# Patient Record
Sex: Female | Born: 1985 | Race: White | Hispanic: No | Marital: Single | Smoking: Current some day smoker
Health system: Southern US, Community
[De-identification: ages and names within clinical notes are randomized; demographics above are authoritative.]

## PROBLEM LIST (undated history)

## (undated) ENCOUNTER — Inpatient Hospital Stay (HOSPITAL_COMMUNITY): Payer: Self-pay

## (undated) DIAGNOSIS — B999 Unspecified infectious disease: Secondary | ICD-10-CM

## (undated) DIAGNOSIS — D649 Anemia, unspecified: Secondary | ICD-10-CM

## (undated) DIAGNOSIS — F32A Depression, unspecified: Secondary | ICD-10-CM

## (undated) DIAGNOSIS — Z87891 Personal history of nicotine dependence: Secondary | ICD-10-CM

## (undated) DIAGNOSIS — O26899 Other specified pregnancy related conditions, unspecified trimester: Secondary | ICD-10-CM

## (undated) DIAGNOSIS — F329 Major depressive disorder, single episode, unspecified: Secondary | ICD-10-CM

## (undated) DIAGNOSIS — Z332 Encounter for elective termination of pregnancy: Secondary | ICD-10-CM

## (undated) DIAGNOSIS — F419 Anxiety disorder, unspecified: Secondary | ICD-10-CM

## (undated) DIAGNOSIS — F1911 Other psychoactive substance abuse, in remission: Secondary | ICD-10-CM

## (undated) DIAGNOSIS — R12 Heartburn: Secondary | ICD-10-CM

## (undated) DIAGNOSIS — R51 Headache: Secondary | ICD-10-CM

## (undated) DIAGNOSIS — Z8659 Personal history of other mental and behavioral disorders: Secondary | ICD-10-CM

## (undated) DIAGNOSIS — R519 Headache, unspecified: Secondary | ICD-10-CM

---

## 2014-02-18 LAB — OB RESULTS CONSOLE HIV ANTIBODY (ROUTINE TESTING): HIV: NONREACTIVE

## 2014-02-18 LAB — OB RESULTS CONSOLE ANTIBODY SCREEN: Antibody Screen: NEGATIVE

## 2014-02-18 LAB — OB RESULTS CONSOLE RPR: RPR: NONREACTIVE

## 2014-02-18 LAB — OB RESULTS CONSOLE RUBELLA ANTIBODY, IGM: Rubella: IMMUNE

## 2014-02-18 LAB — OB RESULTS CONSOLE ABO/RH: RH Type: POSITIVE

## 2014-02-18 LAB — OB RESULTS CONSOLE HEPATITIS B SURFACE ANTIGEN: Hepatitis B Surface Ag: NEGATIVE

## 2014-06-20 ENCOUNTER — Inpatient Hospital Stay (HOSPITAL_COMMUNITY): Admission: AD | Admit: 2014-06-20 | Payer: Self-pay | Source: Ambulatory Visit | Admitting: Obstetrics and Gynecology

## 2014-08-20 ENCOUNTER — Other Ambulatory Visit (HOSPITAL_COMMUNITY): Payer: Self-pay | Admitting: Obstetrics and Gynecology

## 2014-08-20 DIAGNOSIS — O365933 Maternal care for other known or suspected poor fetal growth, third trimester, fetus 3: Secondary | ICD-10-CM

## 2014-08-20 DIAGNOSIS — Z3A32 32 weeks gestation of pregnancy: Secondary | ICD-10-CM

## 2014-08-20 DIAGNOSIS — Z3689 Encounter for other specified antenatal screening: Secondary | ICD-10-CM

## 2014-08-23 ENCOUNTER — Other Ambulatory Visit (HOSPITAL_COMMUNITY): Payer: Self-pay | Admitting: Obstetrics and Gynecology

## 2014-08-23 ENCOUNTER — Ambulatory Visit (HOSPITAL_COMMUNITY)
Admission: RE | Admit: 2014-08-23 | Discharge: 2014-08-23 | Disposition: A | Discharge status: Home / Self Care | Payer: Medicaid Other | Source: Ambulatory Visit | Attending: Obstetrics and Gynecology | Admitting: Obstetrics and Gynecology

## 2014-08-23 ENCOUNTER — Other Ambulatory Visit (HOSPITAL_COMMUNITY): Payer: Self-pay

## 2014-08-23 ENCOUNTER — Encounter (HOSPITAL_COMMUNITY): Payer: Self-pay

## 2014-08-23 DIAGNOSIS — Z3689 Encounter for other specified antenatal screening: Secondary | ICD-10-CM

## 2014-08-23 DIAGNOSIS — Z3A32 32 weeks gestation of pregnancy: Secondary | ICD-10-CM

## 2014-08-23 DIAGNOSIS — O365930 Maternal care for other known or suspected poor fetal growth, third trimester, not applicable or unspecified: Secondary | ICD-10-CM | POA: Insufficient documentation

## 2014-08-23 DIAGNOSIS — O3421 Maternal care for scar from previous cesarean delivery: Secondary | ICD-10-CM | POA: Diagnosis not present

## 2014-08-23 DIAGNOSIS — O365933 Maternal care for other known or suspected poor fetal growth, third trimester, fetus 3: Secondary | ICD-10-CM

## 2014-08-23 DIAGNOSIS — O365990 Maternal care for other known or suspected poor fetal growth, unspecified trimester, not applicable or unspecified: Secondary | ICD-10-CM | POA: Insufficient documentation

## 2014-08-23 NOTE — Consult Note (Signed)
Maternal Fetal Medicine Consultation  Requesting Provider(s): Osborn CohoAngela Roberts, MD  Reason for consultation: Lagging growth, elevated UA Doppler S/D ratio at [redacted] weeks gestation  HPI: Christine ClarkRebecca Lynn Beck is a 28 year old G2P1001 currently at 32w 3d who is referred for testing and delivery recommendations due to lagging growth and elevated UA S/D ratios noted on recent clinic ultrasound.  Ms. Su HiltRoberts reports a history of a previous term C-section due to non-reassuring fetal tracing.  Her prenatal course has otherwise been uncomplicated.  The fetus is active, and she is without complaints today.  OB History: OB History    Gravida Para Term Preterm AB TAB SAB Ectopic Multiple Living   4 1 1  0 2 0 2 0 0 1      PMH: History reviewed. No pertinent past medical history.  PSH: C-section  Meds: Prenatal vitamins, Iron supplements  Allergies:  Allergies  Allergen Reactions  . Abilify [Aripiprazole]    FH: History reviewed. No pertinent family history.   Soc:  Denies tobacco, ETOH use  Review of Systems: no vaginal bleeding or cramping/contractions, no LOF, no nausea/vomiting. All other systems reviewed and are negative.  PE:  158#, 92/68, 78  GEN: well-appearing female ABD: gravid, NT  Ultrasound: Single IUP at 32w 3d The overall estimated fetal weight is at the 27th %tile.  The AC measures at the 7th %tile. BPP 8/8 UA Doppler studies - high normal S/D ratio (93rd %tile).  No AEDF or REDF noted. Normal amniotic fluid volume  A/P: 1) Single IUP at 32w 3d         2) Lagging growth / asymmetric pattern (Overall EFW at the 27th %tile, AC at the 7th %tile) - Ultrasound findings and recommendations were discussed.  Recommendations: 1) Start antenatal testing now - recommend 2x weekly NSTs with weekly AFIs and UA Doppler studies. 2) Ultrasound for growth in 3 weeks. 3) Would recommend delivery at or after 34 weeks if absent end-diastolic flow is noted on follow up testing. 3) If the  estimated fetal weight is at or below the 10th%tile at follow up exam and testing is otherwise reassuring, would recommend delivery at 37 weeks (the patient reports that she has a current repeat C-section date at 37 weeks now).  If the interval growth is appropriate and testing is otherwise reassuring, would recommend delivery at 38-[redacted] weeks gestation.  Please contact our office if you would prefer that follow up ultrasounds or antenatal testing be performed with MFM.   Thank you for the opportunity to be a part of the care of Christine Beck. Please contact our office if we can be of further assistance.   I spent approximately 30 minutes with this patient with over 50% of time spent in face-to-face counseling.  Alpha GulaPaul Camisha Srey, MD Maternal Fetal Medicine

## 2014-08-30 ENCOUNTER — Other Ambulatory Visit: Payer: Self-pay | Admitting: Obstetrics and Gynecology

## 2014-08-31 ENCOUNTER — Encounter (HOSPITAL_COMMUNITY): Payer: Self-pay | Admitting: *Deleted

## 2014-08-31 ENCOUNTER — Inpatient Hospital Stay (HOSPITAL_COMMUNITY)
Admission: AD | Admit: 2014-08-31 | Discharge: 2014-08-31 | Disposition: A | Discharge status: Home / Self Care | Payer: Medicaid Other | Source: Ambulatory Visit | Attending: Obstetrics and Gynecology | Admitting: Obstetrics and Gynecology

## 2014-08-31 ENCOUNTER — Inpatient Hospital Stay (HOSPITAL_COMMUNITY): Payer: Medicaid Other

## 2014-08-31 DIAGNOSIS — Z609 Problem related to social environment, unspecified: Secondary | ICD-10-CM

## 2014-08-31 DIAGNOSIS — F411 Generalized anxiety disorder: Secondary | ICD-10-CM | POA: Diagnosis not present

## 2014-08-31 DIAGNOSIS — O365930 Maternal care for other known or suspected poor fetal growth, third trimester, not applicable or unspecified: Secondary | ICD-10-CM | POA: Diagnosis present

## 2014-08-31 DIAGNOSIS — F329 Major depressive disorder, single episode, unspecified: Secondary | ICD-10-CM | POA: Diagnosis present

## 2014-08-31 DIAGNOSIS — Z659 Problem related to unspecified psychosocial circumstances: Secondary | ICD-10-CM

## 2014-08-31 DIAGNOSIS — O3421 Maternal care for scar from previous cesarean delivery: Secondary | ICD-10-CM | POA: Insufficient documentation

## 2014-08-31 DIAGNOSIS — O365990 Maternal care for other known or suspected poor fetal growth, unspecified trimester, not applicable or unspecified: Secondary | ICD-10-CM | POA: Diagnosis present

## 2014-08-31 DIAGNOSIS — O99343 Other mental disorders complicating pregnancy, third trimester: Secondary | ICD-10-CM | POA: Insufficient documentation

## 2014-08-31 DIAGNOSIS — F32A Depression, unspecified: Secondary | ICD-10-CM | POA: Diagnosis present

## 2014-08-31 DIAGNOSIS — Z3A33 33 weeks gestation of pregnancy: Secondary | ICD-10-CM | POA: Diagnosis not present

## 2014-08-31 DIAGNOSIS — O34219 Maternal care for unspecified type scar from previous cesarean delivery: Secondary | ICD-10-CM | POA: Diagnosis present

## 2014-08-31 DIAGNOSIS — IMO0002 Reserved for concepts with insufficient information to code with codable children: Secondary | ICD-10-CM

## 2014-08-31 DIAGNOSIS — F988 Other specified behavioral and emotional disorders with onset usually occurring in childhood and adolescence: Secondary | ICD-10-CM | POA: Diagnosis present

## 2014-08-31 MED ORDER — BETAMETHASONE SOD PHOS & ACET 6 (3-3) MG/ML IJ SUSP
12.0000 mg | INTRAMUSCULAR | Status: DC
  Administered 2014-08-31: 12 mg via INTRAMUSCULAR
  Filled 2014-08-31 (×2): qty 2

## 2014-08-31 NOTE — MAU Provider Note (Signed)
History   28 yo G4P1021 at 4633 4/7 weeks presented as directed by Dr. Su Hiltoberts for NST and betamethasone course due to IUGR and abnormal dopplers.  Had BPP 12/23 at office, with BPP 8/8.  Scheduled for office visit 12/28, with US for growth/BPP/dopplers. Scheduled for NST 09/05/14.  Reports +FM, denies leaking, bleeding, or any other issue.  Patient Active Problem List   Diagnosis Date Noted  . Generalized anxiety disorder 08/31/2014  . Chronic depression 08/31/2014  . Previous cesarean delivery, antepartum condition or complication 08/31/2014  . Imprisonment--06/2013-11/2013 08/31/2014  . Social problem--mother killed in fire 2014, father with terminal lung CA, patient in custody battle  08/31/2014  . ADD (attention deficit disorder) 08/31/2014  . 32 week prematurity   . IUGR (intrauterine growth restriction) affecting care of mother--abnormal dopplers     Chief Complaint  Patient presents with  . Non-stress Test   HPI:  As above  OB History    Gravida Para Term Preterm AB TAB SAB Ectopic Multiple Living   4 1 1  0 2 0 2 0 0 1      Past Medical History  Diagnosis Date  . Medical history non-contributory     Past Surgical History  Procedure Laterality Date  . Cesarean section      History reviewed. No pertinent family history.  History  Substance Use Topics  . Smoking status: Never Smoker   . Smokeless tobacco: Not on file  . Alcohol Use: No    Allergies:  Allergies  Allergen Reactions  . Abilify [Aripiprazole]     Prescriptions prior to admission  Medication Sig Dispense Refill Last Dose  . Prenatal Vit w/Fe-Methylfol-FA (PNV PO) Take by mouth.       ROS:  +FM  Physical Exam   Blood pressure 81/51, pulse 95, temperature 97.4 F (36.3 C), temperature source Oral, resp. rate 16, height 5\' 2"  (1.575 m), weight 160 lb 12.8 oz (72.938 kg).  Physical Exam  Chest clear Heart RRR without murmur Abd gravid, NT Pelvic--deferred Ext WNL  FHR--Current  baseline appears to be 150-160 in current sleep cycle.  Prior tracing difficult to establish baseline due to ongoing fetal movement and possible broad accels with quick returns to baseline vs higher baseline with variables. UCs none  ED Course  Assessment: IUP at 33 4/7 weeks IUGR Abnormal dopplers Previous C/S, plans repeat Hx anxiety/depression  Plan: BPP/AFI.   Nigel BridgemanLATHAM, Seletha Zimmermann CNM, MSN 08/31/2014 1:21 PM  Addendum: Returned from US:  BPP 8/8, AFI 14.75, 52%ile, vtx.  FHR Category 1 on tracing prior to BPP.  No UCs.  Consulted with Dr. Normand Sloopillard. D/C home. Return to MAU tomorrow for 2nd dose betamethasone. Keep scheduled ROB visit with US/BPP on Monday. FKC discussed. F/U prn with any issues or concerns.  Nigel BridgemanVicki Tyrez Berrios, CNM 08/31/14 3:40p

## 2014-08-31 NOTE — MAU Note (Signed)
Pt sent in for betamethasone injections and NST.  Denies LOF/VB/cramping.

## 2014-09-01 ENCOUNTER — Inpatient Hospital Stay (HOSPITAL_COMMUNITY)
Admission: AD | Admit: 2014-09-01 | Discharge: 2014-09-01 | Disposition: A | Discharge status: Home / Self Care | Payer: Medicaid Other | Source: Ambulatory Visit | Attending: Obstetrics & Gynecology | Admitting: Obstetrics & Gynecology

## 2014-09-01 DIAGNOSIS — Z3A33 33 weeks gestation of pregnancy: Secondary | ICD-10-CM | POA: Insufficient documentation

## 2014-09-01 MED ORDER — BETAMETHASONE SOD PHOS & ACET 6 (3-3) MG/ML IJ SUSP
12.0000 mg | Freq: Once | INTRAMUSCULAR | Status: AC
  Administered 2014-09-01: 12 mg via INTRAMUSCULAR
  Filled 2014-09-01: qty 2

## 2014-09-01 NOTE — MAU Note (Signed)
Pt presents to MAU for 2nd betamethasone injection. Denies any reaction from first injection

## 2014-09-02 ENCOUNTER — Encounter (HOSPITAL_COMMUNITY): Payer: Self-pay | Admitting: *Deleted

## 2014-09-02 ENCOUNTER — Inpatient Hospital Stay (HOSPITAL_COMMUNITY)
Admission: AD | Admit: 2014-09-02 | Discharge: 2014-09-02 | Disposition: A | Discharge status: Home / Self Care | Payer: Medicaid Other | Source: Ambulatory Visit | Attending: Obstetrics & Gynecology | Admitting: Obstetrics & Gynecology

## 2014-09-02 DIAGNOSIS — F411 Generalized anxiety disorder: Secondary | ICD-10-CM | POA: Insufficient documentation

## 2014-09-02 DIAGNOSIS — O99343 Other mental disorders complicating pregnancy, third trimester: Secondary | ICD-10-CM | POA: Insufficient documentation

## 2014-09-02 DIAGNOSIS — O365930 Maternal care for other known or suspected poor fetal growth, third trimester, not applicable or unspecified: Secondary | ICD-10-CM | POA: Diagnosis present

## 2014-09-02 DIAGNOSIS — Z3A33 33 weeks gestation of pregnancy: Secondary | ICD-10-CM | POA: Insufficient documentation

## 2014-09-02 DIAGNOSIS — O3421 Maternal care for scar from previous cesarean delivery: Secondary | ICD-10-CM | POA: Diagnosis not present

## 2014-09-02 HISTORY — DX: Anxiety disorder, unspecified: F41.9

## 2014-09-02 HISTORY — DX: Headache: R51

## 2014-09-02 HISTORY — DX: Headache, unspecified: R51.9

## 2014-09-02 HISTORY — DX: Depression, unspecified: F32.A

## 2014-09-02 HISTORY — DX: Major depressive disorder, single episode, unspecified: F32.9

## 2014-09-02 HISTORY — DX: Unspecified infectious disease: B99.9

## 2014-09-02 NOTE — MAU Provider Note (Signed)
History  28 yo G4P1021 @ 33.6 wks presents to MAU from office [facility]ST [facility]2 breathing). IUGR and abnormal dopplers since 32 wks. Pt reports active fetus. Denies LOF, ctxs or VB.  Patient Active Problem List   Diagnosis Date Noted  . [redacted] weeks gestation of pregnancy   . Generalized anxiety disorder 08/31/2014  . Chronic depression 08/31/2014  . Previous cesarean delivery, antepartum condition or complication 08/31/2014  . Imprisonment--06/2013-11/2013 08/31/2014  . Social problem--mother killed in fire 2014, father with terminal lung CA, patient in custody battle  08/31/2014  . ADD (attention deficit disorder) 08/31/2014  . 32 week prematurity   . IUGR (intrauterine growth restriction) affecting care of mother--abnormal dopplers     Chief Complaint  Patient presents with  . NST    HPI As above OB History    Gravida Para Term Preterm AB TAB SAB Ectopic Multiple Living   4 1 1  0 2 0 2 0 0 1      Obstetric Comments   Mat and fetal heart rate bottomed out when on pit      Past Medical History  Diagnosis Date  . Medical history non-contributory   . Headache   . Infection     UTI  . Anxiety   . Depression     Past Surgical History  Procedure Laterality Date  . Cesarean section      Family History  Problem Relation Age of Onset  . Cancer Father     History  Substance Use Topics  . Smoking status: Never Smoker   . Smokeless tobacco: Never Used  . Alcohol Use: No    Allergies:  Allergies  Allergen Reactions  . Abilify [Aripiprazole] Other (See Comments)    Walking seizure    Prescriptions prior to admission  Medication Sig Dispense Refill Last Dose  . acetaminophen (TYLENOL) 500 MG tablet Take 1,000 mg by mouth every 6 (six) hours as needed for mild pain.   09/02/2014 at Unknown time  . ferrous sulfate 325 (65 FE) MG tablet Take 325 mg by mouth 2 (two) times daily with a meal.   09/02/2014 at Unknown time  . Prenatal Vit w/Fe-Methylfol-FA (PNV PO)  Take 1 tablet by mouth daily.    09/02/2014 at Unknown time    ROS  +FM Physical Exam   Blood pressure 99/56, pulse 92, temperature 97.9 F (36.6 C), temperature source Oral, resp. rate 18.  Physical Exam Gen: NAD Abd: gravid, soft, NT Pelvic: Deferred FHRT: BL 130 w/ moderate variability, +accels, no decels UCs: None per toco, none palpated ED Course  Assessment: Cat 1 FHRT  Plan: D/C home Strict PTL precautions OB f/u as previously scheduled   Sherre ScarletWILLIAMS, Miraj Truss CNM, MS 09/02/2014 5:43 PM

## 2014-09-02 NOTE — MAU Note (Signed)
Pt has hx of IUGR w/ abn doppler studies.  6/8 on office BPP, sent over for NST.

## 2014-09-04 ENCOUNTER — Other Ambulatory Visit: Payer: Self-pay | Admitting: Obstetrics and Gynecology

## 2014-09-23 ENCOUNTER — Encounter (HOSPITAL_COMMUNITY): Payer: Self-pay | Admitting: *Deleted

## 2014-09-23 ENCOUNTER — Other Ambulatory Visit (HOSPITAL_COMMUNITY): Payer: Medicaid Other

## 2014-09-23 NOTE — Patient Instructions (Signed)
   Your procedure is scheduled on:  Tuesday, Jan 19  [facility] at:  10 AM Pick up the phone at the desk and dial [phone] and inform us of your arrival.  Please call this number if you have any problems the morning of surgery: [phone]  Remember: Do not eat or drink after midnight: Monday Take these medicines the morning of surgery with a SIP OF WATER:  None  Do not wear jewelry, make-up, or FINGER nail polish No metal in your hair or on your body. Do not wear lotions, powders, perfumes.  You may wear deodorant.  Do not bring valuables to the hospital. Contacts, dentures or bridgework may not be worn into surgery.  Leave suitcase in the car. After Surgery it may be brought to your room. For patients being admitted to the hospital, checkout time is 11:00am the day of discharge.

## 2014-09-25 DIAGNOSIS — B951 Streptococcus, group B, as the cause of diseases classified elsewhere: Secondary | ICD-10-CM | POA: Diagnosis present

## 2014-09-25 NOTE — H&P (Signed)
Christine Beck is a 29 y.o. female, G4P1021 at 4538 weeks, presenting for scheduled repeat cesearean.  Denies leaking or bleeding, reports +FM.  Patient Active Problem List   Diagnosis Date Noted  . Positive GBS test 09/25/2014  . Generalized anxiety disorder 08/31/2014  . Chronic depression 08/31/2014  . Previous cesarean delivery, antepartum condition or complication 08/31/2014  . Imprisonment--06/2013-11/2013 08/31/2014  . Social problem--mother killed in fire 2014, father with terminal lung CA, patient in custody battle  08/31/2014  . ADD (attention deficit disorder) 08/31/2014  . IUGR (intrauterine growth restriction) affecting care of mother, hx abnormal dopplers--normal growth and dopplers at 37 weeks.     History of present pregnancy: Patient entered care at 6 1/7 weeks.   EDC of 10/15/14 was established by LMP and was concordant with US at 6 2/7 weeks.   Anatomy scan:  18 3/7 weeks, with normal findings and an posterior placenta.   Additional US evaluations:   31 2/7 weeks for S<D:  EFW 3 lbs, 3.5%, AFI 12.81, 35%ile, BPP 8/8, elevated UA dopplers, no reverse/absent flow. 32 3/7 weeks:  EFW 1654 gm, 3+10, 27%ile, AFI 11.46, 28%ile, AC 7%ile, high normal dopplers, vtx.  Weekly BPPs were WNL, with persistently elevated dopplers, normal fluid.  Single BPP at 35 6/7 weeks was 6/8, but had reactive NST. 33 6/7 weeks:  EFW 4+7, 13%ile, AFI 12.5, 40%ile, elevated dopplers, no absent/reverse flow.  BPP 8/8, vtx. 34 5/7 weeks:  EFW 5+12, 15%ile, AFI 13.1, 50%ile, dopplers WNL, BPP 8/8 Significant prenatal events:  Entered care at 6 1/7 weeks.  Taking clonazepam in early pregnancy, d/c'd.  Took Zoloft during most of pregnancy.  Had been incarcerated last year.  Did not have custody of 1st child, was in custody battle with brother and sister-in-law in [place].  Traveled there several times during pregnancy.  Several urine drug screen done at patient's request during pregnancy at patient's request  for documentation for the custody case--all negative.  Z pak given at 29 weeks due to persistent cough with mucus.  Patient awarded custody of son at 428 weeks.  US at 32 weeks showed IUGR and abnormal dopplers, so patient was followed closely from that point.  Initial plan made for repeat C/S at 37 weeks per recommendation of MFM, but IUGR and abnormal dopplers resolved on US on 09/24/14, so C/S delayed until 38 weeks. Last evaluation:  09/24/14--US with growth at 15%ile, normal fluid and dopplers.  C/S rescheduled for 38 weeks (vs 37 weeks)  OB History    Gravida Para Term Preterm AB TAB SAB Ectopic Multiple Living   4 1 1  0 2 2 0 0 0 2      Obstetric Comments   Mat and fetal heart rate bottomed out when on pit    2005--TAB at 6 weeks 2006--TAB at 6 weeks 2013--Primary LTCS due to NRFHR, 39 weeks, 12 hour labor, 5+10, delivered in [place] (LTCS, with single layer closure)    Past Medical History  Diagnosis Date  . Medical history non-contributory   . Headache   . Infection     UTI  . Anxiety   . Depression   . Termination of pregnancy (fetus) x 2    2005, 2006 - both at 6 wks   Past Surgical History  Procedure Laterality Date  . Cesarean section     Family History: family history includes Cancer in her father. Mother killed in fire 12/2012,  Social History:  reports that she has never smoked. She  has never used smokeless tobacco. She reports that she does not drink alcohol or use illicit drugs.  Patient is single, Caucasian, high-school educated, employed as a Production assistant, radio, of the [employer].  Had negative UDS several times during pregnancy, done due to custody battle.  Recently moved to [facility] from [place].   Prenatal Transfer Tool  Maternal Diabetes: No Genetic Screening: Normal 1st trimester screen Maternal Ultrasounds/Referrals: Abnormal:  Findings:   IUGR, Other:Abnormal dopplers--IUGR and abnormal dopplers since 32 weeks, normalized at 37 weeks. Fetal Ultrasounds or  other Referrals:  Referred to Materal Fetal Medicine --Seen 08/23/14 due to lagging growth, elevated dopplers at 32 weeks. Maternal Substance Abuse:  No--several negative UDS during pregnancy due to custody battle, last one 07/05/14 Significant Maternal Medications:  None Significant Maternal Lab Results: Lab values include: Group B Strep positive  TDAP 08/05/14 Flu 9/11/5  ROS:  +FM, occasional cramping  Allergies  Allergen Reactions  . Abilify [Aripiprazole] Other (See Comments)    Walking seizure      There were no vitals taken for this visit.  Chest clear Heart RRR without murmur Abd gravid, NT, FH 36 weeks Pelvic: Deferred Ext: WNL  FHR: 150 in office UCs:  None  Prenatal labs: ABO, Rh: B/Positive/-- (06/15 0000) Antibody: Negative (06/15 0000) Rubella:    Immune RPR: Nonreactive (06/15 0000)  HBsAg: Negative (06/15 0000)  HIV: Non-reactive (06/15 0000)  GBS:   Positive 09/16/14 Sickle cell/Hgb electrophoresis:  NA Pap:  WNL 03/20/14 GC:  Negative 02/18/14 and 09/16/14 Chlamydia:  Negative 02/18/14 and 09/16/14 Genetic screenings:  Normal 1st trimester screen Glucola:  WNL Other:   Hgb 11.9 at NOB, 10.9 at 28 weeks Urine culture 15L Ecoli at NOB, 3K on repeat culture    Assessment/Plan: IUP at 38 weeks Previous LTCS, desires repeat GBS positive Hx IUGR with abnormal dopplers, normalized 09/24/14 Anxiety/depression/ADHD Social stressors  Plan: Ad[facility] to [facility] per consult with Dr. Normand Sloop Routine pre-op orders Plan SW consult pp  Nyra Capes, [place] 09/25/2014, 12:57 PM

## 2014-09-27 ENCOUNTER — Encounter (HOSPITAL_COMMUNITY): Payer: Self-pay

## 2014-09-27 NOTE — Patient Instructions (Addendum)
   Your procedure is scheduled on:  Tuesday, Jan 26  [facility] at: 10 AM Pick up the phone at the desk and dial [phone] and inform us of your arrival.  Please call this number if you have any problems the morning of surgery: [phone]  Remember: Do not eat or drink after midnight: Monday Take these medicines the morning of surgery with a SIP OF WATER:  None   Do not wear jewelry, make-up, or FINGER nail polish No metal in your hair or on your body. Do not wear lotions, powders, perfumes.  You may wear deodorant.  Do not bring valuables to the hospital. Contacts, dentures or bridgework may not be worn into surgery.  Leave suitcase in the car. After Surgery it may be brought to your room. For patients being admitted to the hospital, checkout time is 11:00am the day of discharge.  Home with fiance Thayer OhmChris cell [phone] or Candy cell [phone]

## 2014-09-30 ENCOUNTER — Encounter (HOSPITAL_COMMUNITY)
Admission: RE | Admit: 2014-09-30 | Discharge: 2014-09-30 | Disposition: A | Discharge status: Home / Self Care | Payer: Medicaid Other | Source: Ambulatory Visit | Attending: Obstetrics and Gynecology | Admitting: Obstetrics and Gynecology

## 2014-09-30 ENCOUNTER — Encounter (HOSPITAL_COMMUNITY): Payer: Self-pay

## 2014-09-30 HISTORY — DX: Anemia, unspecified: D64.9

## 2014-09-30 HISTORY — DX: Other specified pregnancy related conditions, unspecified trimester: O26.899

## 2014-09-30 HISTORY — DX: Heartburn: R12

## 2014-09-30 LAB — CBC
HEMATOCRIT: 34.3 % — AB (ref 36.0–46.0)
Hemoglobin: 11.5 g/dL — ABNORMAL LOW (ref 12.0–15.0)
MCH: 33.7 pg (ref 26.0–34.0)
MCHC: 33.5 g/dL (ref 30.0–36.0)
MCV: 100.6 fL — AB (ref 78.0–100.0)
PLATELETS: 204 10*3/uL (ref 150–400)
RBC: 3.41 MIL/uL — ABNORMAL LOW (ref 3.87–5.11)
RDW: 12.8 % (ref 11.5–15.5)
WBC: 10.1 10*3/uL (ref 4.0–10.5)

## 2014-09-30 LAB — TYPE AND SCREEN
ABO/RH(D): B POS
Antibody Screen: NEGATIVE

## 2014-09-30 LAB — ABO/RH: ABO/RH(D): B POS

## 2014-09-30 NOTE — Anesthesia Preprocedure Evaluation (Addendum)
Anesthesia Evaluation  Patient identified by MRN, date of birth, ID band Patient awake    Reviewed: Allergy & Precautions, NPO status , Patient's Chart, lab work & pertinent test results  History of Anesthesia Complications Negative for: history of anesthetic complications  Airway Mallampati: II  TM Distance: >3 FB Neck ROM: Full    Dental no notable dental hx. (+) Dental Advisory Given   Pulmonary neg pulmonary ROS,  breath sounds clear to auscultation  Pulmonary exam normal       Cardiovascular negative cardio ROS  Rhythm:Regular Rate:Normal     Neuro/Psych  Headaches, PSYCHIATRIC DISORDERS Anxiety Depression    GI/Hepatic negative GI ROS, Neg liver ROS,   Endo/Other  negative endocrine ROS  Renal/GU negative Renal ROS  negative genitourinary   Musculoskeletal negative musculoskeletal ROS (+)   Abdominal   Peds negative pediatric ROS (+)  Hematology  (+) anemia ,   Anesthesia Other Findings   Reproductive/Obstetrics (+) Pregnancy                            Anesthesia Physical Anesthesia Plan  ASA: II  Anesthesia Plan: Spinal   Post-op Pain Management:    Induction:   Airway Management Planned:   Additional Equipment:   Intra-op Plan:   Post-operative Plan:   Informed Consent: I have reviewed the patients History and Physical, chart, labs and discussed the procedure including the risks, benefits and alternatives for the proposed anesthesia with the patient or authorized representative who has indicated his/her understanding and acceptance.   Dental advisory given  Plan Discussed with: CRNA  Anesthesia Plan Comments:         Anesthesia Quick Evaluation

## 2014-10-01 ENCOUNTER — Inpatient Hospital Stay (HOSPITAL_COMMUNITY)
Admission: RE | Admit: 2014-10-01 | Discharge: 2014-10-04 | DRG: 766 | Disposition: A | Discharge status: Home / Self Care | Payer: Medicaid Other | Source: Ambulatory Visit | Attending: Obstetrics and Gynecology | Admitting: Obstetrics and Gynecology

## 2014-10-01 ENCOUNTER — Inpatient Hospital Stay (HOSPITAL_COMMUNITY): Payer: Medicaid Other | Admitting: Anesthesiology

## 2014-10-01 ENCOUNTER — Encounter (HOSPITAL_COMMUNITY): Payer: Self-pay | Admitting: *Deleted

## 2014-10-01 ENCOUNTER — Encounter (HOSPITAL_COMMUNITY)
Admission: RE | Disposition: A | Discharge status: Home / Self Care | Payer: Self-pay | Source: Ambulatory Visit | Attending: Obstetrics and Gynecology

## 2014-10-01 DIAGNOSIS — B951 Streptococcus, group B, as the cause of diseases classified elsewhere: Secondary | ICD-10-CM | POA: Diagnosis present

## 2014-10-01 DIAGNOSIS — D649 Anemia, unspecified: Secondary | ICD-10-CM | POA: Diagnosis not present

## 2014-10-01 DIAGNOSIS — O0973 Supervision of high risk pregnancy due to social problems, third trimester: Secondary | ICD-10-CM

## 2014-10-01 DIAGNOSIS — O99824 Streptococcus B carrier state complicating childbirth: Secondary | ICD-10-CM | POA: Diagnosis present

## 2014-10-01 DIAGNOSIS — F988 Other specified behavioral and emotional disorders with onset usually occurring in childhood and adolescence: Secondary | ICD-10-CM | POA: Diagnosis present

## 2014-10-01 DIAGNOSIS — O99344 Other mental disorders complicating childbirth: Secondary | ICD-10-CM | POA: Diagnosis present

## 2014-10-01 DIAGNOSIS — O3421 Maternal care for scar from previous cesarean delivery: Secondary | ICD-10-CM | POA: Diagnosis present

## 2014-10-01 DIAGNOSIS — O9081 Anemia of the puerperium: Secondary | ICD-10-CM | POA: Diagnosis not present

## 2014-10-01 DIAGNOSIS — N858 Other specified noninflammatory disorders of uterus: Secondary | ICD-10-CM | POA: Diagnosis present

## 2014-10-01 DIAGNOSIS — Z3A38 38 weeks gestation of pregnancy: Secondary | ICD-10-CM | POA: Diagnosis present

## 2014-10-01 DIAGNOSIS — F411 Generalized anxiety disorder: Secondary | ICD-10-CM | POA: Diagnosis present

## 2014-10-01 DIAGNOSIS — Z888 Allergy status to other drugs, medicaments and biological substances status: Secondary | ICD-10-CM

## 2014-10-01 DIAGNOSIS — F329 Major depressive disorder, single episode, unspecified: Secondary | ICD-10-CM | POA: Diagnosis present

## 2014-10-01 DIAGNOSIS — F32A Depression, unspecified: Secondary | ICD-10-CM | POA: Diagnosis present

## 2014-10-01 DIAGNOSIS — F53 Puerperal psychosis: Secondary | ICD-10-CM | POA: Diagnosis not present

## 2014-10-01 DIAGNOSIS — Z98891 History of uterine scar from previous surgery: Secondary | ICD-10-CM

## 2014-10-01 HISTORY — DX: Personal history of other mental and behavioral disorders: Z86.59

## 2014-10-01 HISTORY — DX: Encounter for elective termination of pregnancy: Z33.2

## 2014-10-01 HISTORY — DX: Other psychoactive substance abuse, in remission: F19.11

## 2014-10-01 HISTORY — DX: Personal history of nicotine dependence: Z87.891

## 2014-10-01 LAB — RPR: RPR: NONREACTIVE

## 2014-10-01 SURGERY — Surgical Case
Anesthesia: Spinal | Site: Abdomen

## 2014-10-01 MED ORDER — DIPHENHYDRAMINE HCL 25 MG PO CAPS: 25.0000 mg | ORAL_CAPSULE | Freq: Four times a day (QID) | ORAL | Status: DC | PRN

## 2014-10-01 MED ORDER — BUPIVACAINE IN DEXTROSE 0.75-8.25 % IT SOLN
INTRATHECAL | Status: DC | PRN
  Administered 2014-10-01: 1.6 mL via INTRATHECAL

## 2014-10-01 MED ORDER — FLEET ENEMA 7-19 GM/118ML RE ENEM: 1.0000 | ENEMA | Freq: Every day | RECTAL | Status: DC | PRN

## 2014-10-01 MED ORDER — ERYTHROMYCIN 5 MG/GM OP OINT
TOPICAL_OINTMENT | OPHTHALMIC | Status: AC
  Filled 2014-10-01: qty 1

## 2014-10-01 MED ORDER — CEFAZOLIN SODIUM-DEXTROSE 2-3 GM-% IV SOLR
2.0000 g | INTRAVENOUS | Status: AC
  Administered 2014-10-01: 2 g via INTRAVENOUS

## 2014-10-01 MED ORDER — OXYCODONE-ACETAMINOPHEN 5-325 MG PO TABS
2.0000 | ORAL_TABLET | ORAL | Status: DC | PRN
  Administered 2014-10-01 – 2014-10-04 (×15): 2 via ORAL
  Filled 2014-10-01 (×15): qty 2

## 2014-10-01 MED ORDER — PRENATAL MULTIVITAMIN CH
1.0000 | ORAL_TABLET | Freq: Every day | ORAL | Status: DC
  Administered 2014-10-02 – 2014-10-03 (×2): 1 via ORAL
  Filled 2014-10-01 (×2): qty 1

## 2014-10-01 MED ORDER — ONDANSETRON HCL 4 MG/2ML IJ SOLN: 4.0000 mg | Freq: Three times a day (TID) | INTRAMUSCULAR | Status: DC | PRN

## 2014-10-01 MED ORDER — NALBUPHINE HCL 10 MG/ML IJ SOLN: 5.0000 mg | INTRAMUSCULAR | Status: DC | PRN

## 2014-10-01 MED ORDER — SODIUM CHLORIDE 0.9 % IJ SOLN: 3.0000 mL | INTRAMUSCULAR | Status: DC | PRN

## 2014-10-01 MED ORDER — LACTATED RINGERS IV SOLN
INTRAVENOUS | Status: DC | PRN
  Administered 2014-10-01 (×3): via INTRAVENOUS

## 2014-10-01 MED ORDER — FENTANYL CITRATE 0.05 MG/ML IJ SOLN
25.0000 ug | INTRAMUSCULAR | Status: DC | PRN
  Administered 2014-10-01 (×2): 50 ug via INTRAVENOUS

## 2014-10-01 MED ORDER — WITCH HAZEL-GLYCERIN EX PADS: 1.0000 "application " | MEDICATED_PAD | CUTANEOUS | Status: DC | PRN

## 2014-10-01 MED ORDER — ONDANSETRON HCL 4 MG/2ML IJ SOLN: 4.0000 mg | Freq: Once | INTRAMUSCULAR | Status: DC | PRN

## 2014-10-01 MED ORDER — MEASLES, MUMPS & RUBELLA VAC ~~LOC~~ INJ: 0.5000 mL | INJECTION | Freq: Once | SUBCUTANEOUS | Status: DC

## 2014-10-01 MED ORDER — ONDANSETRON HCL 4 MG/2ML IJ SOLN
INTRAMUSCULAR | Status: AC
  Filled 2014-10-01: qty 2

## 2014-10-01 MED ORDER — DIPHENHYDRAMINE HCL 25 MG PO CAPS: 25.0000 mg | ORAL_CAPSULE | ORAL | Status: DC | PRN

## 2014-10-01 MED ORDER — FAMOTIDINE IN NACL 20-0.9 MG/50ML-% IV SOLN
INTRAVENOUS | Status: AC
  Filled 2014-10-01: qty 50

## 2014-10-01 MED ORDER — FENTANYL CITRATE 0.05 MG/ML IJ SOLN
INTRAMUSCULAR | Status: AC
  Administered 2014-10-01: 50 ug via INTRAVENOUS
  Filled 2014-10-01: qty 2

## 2014-10-01 MED ORDER — FERROUS SULFATE 325 (65 FE) MG PO TABS
325.0000 mg | ORAL_TABLET | Freq: Two times a day (BID) | ORAL | Status: DC
  Administered 2014-10-02 – 2014-10-04 (×5): 325 mg via ORAL
  Filled 2014-10-01 (×5): qty 1

## 2014-10-01 MED ORDER — PHENYLEPHRINE 8 MG IN D5W 100 ML (0.08MG/ML) PREMIX OPTIME
INJECTION | INTRAVENOUS | Status: AC
  Filled 2014-10-01: qty 100

## 2014-10-01 MED ORDER — KETOROLAC TROMETHAMINE 30 MG/ML IJ SOLN
30.0000 mg | Freq: Four times a day (QID) | INTRAMUSCULAR | Status: AC | PRN
Start: 2014-10-01 — End: 2014-10-02

## 2014-10-01 MED ORDER — NALBUPHINE HCL 10 MG/ML IJ SOLN: 5.0000 mg | Freq: Once | INTRAMUSCULAR | Status: AC | PRN

## 2014-10-01 MED ORDER — ONDANSETRON HCL 4 MG/2ML IJ SOLN
INTRAMUSCULAR | Status: DC | PRN
  Administered 2014-10-01: 4 mg via INTRAVENOUS

## 2014-10-01 MED ORDER — FENTANYL CITRATE 0.05 MG/ML IJ SOLN
INTRAMUSCULAR | Status: DC | PRN
  Administered 2014-10-01: 90 ug via INTRAVENOUS
  Administered 2014-10-01: 10 ug via INTRATHECAL

## 2014-10-01 MED ORDER — DIBUCAINE 1 % RE OINT: 1.0000 "application " | TOPICAL_OINTMENT | RECTAL | Status: DC | PRN

## 2014-10-01 MED ORDER — ONDANSETRON HCL 4 MG PO TABS: 4.0000 mg | ORAL_TABLET | ORAL | Status: DC | PRN

## 2014-10-01 MED ORDER — LANOLIN HYDROUS EX OINT
1.0000 | TOPICAL_OINTMENT | CUTANEOUS | Status: DC | PRN
Start: 2014-10-01 — End: 2014-10-04

## 2014-10-01 MED ORDER — SENNOSIDES-DOCUSATE SODIUM 8.6-50 MG PO TABS
2.0000 | ORAL_TABLET | ORAL | Status: DC
  Administered 2014-10-01 – 2014-10-04 (×3): 2 via ORAL
  Filled 2014-10-01 (×3): qty 2

## 2014-10-01 MED ORDER — SCOPOLAMINE 1 MG/3DAYS TD PT72
1.0000 | MEDICATED_PATCH | Freq: Once | TRANSDERMAL | Status: DC
  Administered 2014-10-01: 1.5 mg via TRANSDERMAL

## 2014-10-01 MED ORDER — LACTATED RINGERS IV SOLN
40.0000 [IU] | INTRAVENOUS | Status: DC | PRN
  Administered 2014-10-01: 40 [IU] via INTRAVENOUS

## 2014-10-01 MED ORDER — SCOPOLAMINE 1 MG/3DAYS TD PT72
MEDICATED_PATCH | TRANSDERMAL | Status: AC
  Administered 2014-10-01: 1.5 mg via TRANSDERMAL
  Filled 2014-10-01: qty 1

## 2014-10-01 MED ORDER — LACTATED RINGERS IV SOLN
Freq: Once | INTRAVENOUS | Status: AC
  Administered 2014-10-01: 11:00:00 via INTRAVENOUS

## 2014-10-01 MED ORDER — ONDANSETRON HCL 4 MG/2ML IJ SOLN: 4.0000 mg | INTRAMUSCULAR | Status: DC | PRN

## 2014-10-01 MED ORDER — DIPHENHYDRAMINE HCL 50 MG/ML IJ SOLN: 12.5000 mg | INTRAMUSCULAR | Status: DC | PRN

## 2014-10-01 MED ORDER — MORPHINE SULFATE (PF) 0.5 MG/ML IJ SOLN
INTRAMUSCULAR | Status: DC | PRN
  Administered 2014-10-01: .2 mg via INTRATHECAL

## 2014-10-01 MED ORDER — FAMOTIDINE IN NACL 20-0.9 MG/50ML-% IV SOLN
20.0000 mg | Freq: Once | INTRAVENOUS | Status: AC
  Administered 2014-10-01: 20 mg via INTRAVENOUS

## 2014-10-01 MED ORDER — ZOLPIDEM TARTRATE 5 MG PO TABS: 5.0000 mg | ORAL_TABLET | Freq: Every evening | ORAL | Status: DC | PRN

## 2014-10-01 MED ORDER — SIMETHICONE 80 MG PO CHEW
80.0000 mg | CHEWABLE_TABLET | ORAL | Status: DC
  Administered 2014-10-01 – 2014-10-04 (×3): 80 mg via ORAL
  Filled 2014-10-01 (×3): qty 1

## 2014-10-01 MED ORDER — OXYTOCIN 40 UNITS IN LACTATED RINGERS INFUSION - SIMPLE MED: 62.5000 mL/h | INTRAVENOUS | Status: AC

## 2014-10-01 MED ORDER — TETANUS-DIPHTH-ACELL PERTUSSIS 5-2.5-18.5 LF-MCG/0.5 IM SUSP: 0.5000 mL | Freq: Once | INTRAMUSCULAR | Status: DC

## 2014-10-01 MED ORDER — NALOXONE HCL 1 MG/ML IJ SOLN: 1.0000 ug/kg/h | INTRAVENOUS | Status: DC | PRN

## 2014-10-01 MED ORDER — BISACODYL 10 MG RE SUPP: 10.0000 mg | Freq: Every day | RECTAL | Status: DC | PRN

## 2014-10-01 MED ORDER — OXYTOCIN 10 UNIT/ML IJ SOLN
INTRAMUSCULAR | Status: AC
  Filled 2014-10-01: qty 4

## 2014-10-01 MED ORDER — VITAMIN K1 1 MG/0.5ML IJ SOLN
INTRAMUSCULAR | Status: AC
  Filled 2014-10-01: qty 0.5

## 2014-10-01 MED ORDER — MORPHINE SULFATE 0.5 MG/ML IJ SOLN
INTRAMUSCULAR | Status: AC
  Filled 2014-10-01: qty 10

## 2014-10-01 MED ORDER — LACTATED RINGERS IV SOLN
INTRAVENOUS | Status: DC
  Administered 2014-10-01 – 2014-10-02 (×2): via INTRAVENOUS

## 2014-10-01 MED ORDER — SCOPOLAMINE 1 MG/3DAYS TD PT72: 1.0000 | MEDICATED_PATCH | Freq: Once | TRANSDERMAL | Status: DC

## 2014-10-01 MED ORDER — KETOROLAC TROMETHAMINE 30 MG/ML IJ SOLN
INTRAMUSCULAR | Status: AC
  Administered 2014-10-01: 30 mg via INTRAVENOUS
  Filled 2014-10-01: qty 1

## 2014-10-01 MED ORDER — MEPERIDINE HCL 25 MG/ML IJ SOLN: 6.2500 mg | INTRAMUSCULAR | Status: DC | PRN

## 2014-10-01 MED ORDER — OXYCODONE-ACETAMINOPHEN 5-325 MG PO TABS: 1.0000 | ORAL_TABLET | ORAL | Status: DC | PRN

## 2014-10-01 MED ORDER — SIMETHICONE 80 MG PO CHEW: 80.0000 mg | CHEWABLE_TABLET | ORAL | Status: DC | PRN

## 2014-10-01 MED ORDER — MENTHOL 3 MG MT LOZG: 1.0000 | LOZENGE | OROMUCOSAL | Status: DC | PRN

## 2014-10-01 MED ORDER — NALOXONE HCL 0.4 MG/ML IJ SOLN: 0.4000 mg | INTRAMUSCULAR | Status: DC | PRN

## 2014-10-01 MED ORDER — IBUPROFEN 600 MG PO TABS
600.0000 mg | ORAL_TABLET | Freq: Four times a day (QID) | ORAL | Status: DC
  Administered 2014-10-02 – 2014-10-04 (×10): 600 mg via ORAL
  Filled 2014-10-01 (×10): qty 1

## 2014-10-01 MED ORDER — SIMETHICONE 80 MG PO CHEW
80.0000 mg | CHEWABLE_TABLET | Freq: Three times a day (TID) | ORAL | Status: DC
  Administered 2014-10-01 – 2014-10-04 (×6): 80 mg via ORAL
  Filled 2014-10-01 (×7): qty 1

## 2014-10-01 MED ORDER — CEFAZOLIN SODIUM-DEXTROSE 2-3 GM-% IV SOLR
INTRAVENOUS | Status: AC
  Filled 2014-10-01: qty 50

## 2014-10-01 MED ORDER — PHENYLEPHRINE 8 MG IN D5W 100 ML (0.08MG/ML) PREMIX OPTIME
INJECTION | INTRAVENOUS | Status: DC | PRN
  Administered 2014-10-01: 60 ug/min via INTRAVENOUS

## 2014-10-01 MED ORDER — FENTANYL CITRATE 0.05 MG/ML IJ SOLN
INTRAMUSCULAR | Status: AC
  Filled 2014-10-01: qty 2

## 2014-10-01 MED ORDER — KETOROLAC TROMETHAMINE 30 MG/ML IJ SOLN
30.0000 mg | Freq: Four times a day (QID) | INTRAMUSCULAR | Status: AC | PRN
  Administered 2014-10-01 (×2): 30 mg via INTRAVENOUS
  Filled 2014-10-01: qty 1

## 2014-10-01 SURGICAL SUPPLY — 36 items
BENZOIN TINCTURE PRP APPL 2/3 (GAUZE/BANDAGES/DRESSINGS) ×3 IMPLANT
CLAMP CORD UMBIL (MISCELLANEOUS) IMPLANT
CLOSURE WOUND 1/2 X4 (GAUZE/BANDAGES/DRESSINGS) ×1
CLOTH BEACON ORANGE TIMEOUT ST (SAFETY) ×3 IMPLANT
CONTAINER PREFILL 10% NBF 15ML (MISCELLANEOUS) IMPLANT
DRAIN JACKSON PRT FLT 10 (DRAIN) IMPLANT
DRAPE SHEET LG 3/4 BI-LAMINATE (DRAPES) ×3 IMPLANT
DRSG OPSITE POSTOP 4X10 (GAUZE/BANDAGES/DRESSINGS) ×3 IMPLANT
DURAPREP 26ML APPLICATOR (WOUND CARE) ×3 IMPLANT
ELECT REM PT RETURN 9FT ADLT (ELECTROSURGICAL) ×3
ELECTRODE REM PT RTRN 9FT ADLT (ELECTROSURGICAL) ×1 IMPLANT
EVACUATOR SILICONE 100CC (DRAIN) IMPLANT
EXTRACTOR VACUUM M CUP 4 TUBE (SUCTIONS) IMPLANT
EXTRACTOR VACUUM M CUP 4' TUBE (SUCTIONS)
GLOVE BIO SURGEON STRL SZ 6.5 (GLOVE) ×2 IMPLANT
GLOVE BIO SURGEONS STRL SZ 6.5 (GLOVE) ×1
GLOVE BIOGEL PI IND STRL 7.0 (GLOVE) ×1 IMPLANT
GLOVE BIOGEL PI INDICATOR 7.0 (GLOVE) ×2
GOWN STRL REUS W/TWL LRG LVL3 (GOWN DISPOSABLE) ×6 IMPLANT
KIT ABG SYR 3ML LUER SLIP (SYRINGE) IMPLANT
NEEDLE HYPO 25X5/8 SAFETYGLIDE (NEEDLE) IMPLANT
NS IRRIG 1000ML POUR BTL (IV SOLUTION) ×3 IMPLANT
PACK C SECTION WH (CUSTOM PROCEDURE TRAY) ×3 IMPLANT
PAD OB MATERNITY 4.3X12.25 (PERSONAL CARE ITEMS) ×3 IMPLANT
RTRCTR C-SECT PINK 25CM LRG (MISCELLANEOUS) IMPLANT
STRIP CLOSURE SKIN 1/2X4 (GAUZE/BANDAGES/DRESSINGS) ×2 IMPLANT
SUT CHROMIC 0 CT 1 (SUTURE) ×3 IMPLANT
SUT MNCRL AB 3-0 PS2 27 (SUTURE) ×3 IMPLANT
SUT PLAIN 2 0 (SUTURE) ×4
SUT PLAIN 2 0 XLH (SUTURE) ×3 IMPLANT
SUT PLAIN ABS 2-0 CT1 27XMFL (SUTURE) ×2 IMPLANT
SUT SILK 2 0 SH (SUTURE) IMPLANT
SUT VIC AB 0 CTX 36 (SUTURE) ×8
SUT VIC AB 0 CTX36XBRD ANBCTRL (SUTURE) ×4 IMPLANT
TOWEL OR 17X24 6PK STRL BLUE (TOWEL DISPOSABLE) ×3 IMPLANT
TRAY FOLEY CATH 14FR (SET/KITS/TRAYS/PACK) ×3 IMPLANT

## 2014-10-01 NOTE — Op Note (Signed)
Cesarean Section Procedure Note   Christine MorrowRebecca Grau  10/01/2014  Indications: Scheduled Proceedure/Maternal Request and HO IUGR with abnormal dopplers with this pregnacy that normalized   Pre-operative Diagnosis: Prior Cesarean Section; IUGR. Peritoneal window  Post-operative Diagnosis: Same  Procedure Repeat CS.  Uterine curretage  Surgeon: Surgeon(s) and Role:    * Jaymes GraffNaima Dresden Lozito, MD - Primary   Assistants: Manfred ArchV. Latham CNM   Anesthesia: spinal   Procedure Details:  The patient was seen in the Holding Room. The risks, benefits, complications, treatment options, and expected outcomes were discussed with the patient. The patient concurred with the proposed plan, giving informed consent. identified as Christine MorrowRebecca Beck and the procedure verified as C-Section Delivery. A Time Out was held and the above information confirmed.  After induction of anesthesia, the patient was draped and prepped in the usual sterile manner. A transverse incision was made and carried down through the subcutaneous tissue to the fascia. Fascial incision was made in the midline and extended transversely. The fascia was separated from the underlying rectus muscle superiorly and inferiorly. The peritoneum was identified and entered. Peritoneal incision was extended longitudinally with good visualization of bowel and bladder. The utero-vesical peritoneal reflection was incised transversely and the bladder flap was bluntly freed from the lower uterine segment.  An alexsis retractor was placed in the abdomen.   The uterus was already separated.  There was just a peritoneal window at the lower uterine segment.   Delivered from cephalic presentation was a  infant, with Apgar scores of 9 at one minute and 9 at five minutes. Cord ph was sent .  PH 7.34the umbilical cord was clamped and cut cord blood was obtained for evaluation. The placenta was removed Intact and appeared normal. The uterine outline, tubes and ovaries appeared normal}. The  uterine incision was closed with running locked sutures of 0Vicryl. A second layer 0 vicrlyl was used to imbricate the uterine incision    Hemostasis was observed. Lavage was carried out until clear. The alexsis was removed.  The peritoneum was closed with 0 chromic.  The muscles were examined and any bleeders were made hemostatic using bovie cautery device.   The fascia was then reapproximated with running sutures of 0 vicryl.  The subcutaneous tissue was reapproximated  With interrupted stitches using 2-0 plain gut. The subcuticular closure was performed using 3-820monocryl     Instrument, sponge, and needle counts were correct prior the abdominal closure and were correct at the conclusion of the case.    Findings: infant was delivered from vtx presentation. The fluid was clear.  The uterus tubes and ovaries appeared normal.     Estimated Blood Loss: 600cc   Total IV Fluids: 2300ml   Urine Output: 250CC OF clear urine  Specimens: placenta to pathology  Complications: no complications  Disposition: PACU - hemodynamically stable.   Maternal Condition: stable   Baby condition / location:  Couplet care / Skin to Skin  Attending Attestation: I performed the procedure. Pt was told about the peritoneal window in the lower uterine segment of the uterus. Would not recommend a VBAC on this pt in the future. Would recommend CS at 36-37 weeks  Signed: Surgeon(s): Jaymes GraffNaima Macgregor Aeschliman, MD

## 2014-10-01 NOTE — Anesthesia Postprocedure Evaluation (Signed)
  Anesthesia Post-op Note  Patient: Christine MorrowRebecca Intrieri  Procedure(s) Performed: Procedure(s) (LRB): REPEAT CESAREAN SECTION (N/A)  Patient Location: PACU  Anesthesia Type: Spinal  Level of Consciousness: awake and alert   Airway and Oxygen Therapy: Patient Spontanous Breathing  Post-op Pain: mild  Post-op Assessment: Post-op Vital signs reviewed, Patient's Cardiovascular Status Stable, Respiratory Function Stable, Patent Airway and No signs of Nausea or vomiting  Last Vitals:  Filed Vitals:   10/01/14 1400  BP:   Pulse: 65  Temp:   Resp: 28    Post-op Vital Signs: stable   Complications: No apparent anesthesia complications

## 2014-10-01 NOTE — Consult Note (Signed)
Neonatology Note:   Attendance at C-section:    I was asked by Dr. Normand Sloopillard to attend this repeat C/S at 38 weeks due to IUGR at 32 weeks, thought to be resolved. The mother is a G4P1A2 B pos, GBS pos with bipolar disorder, on multiple medications. There was IUGR identified at 32 weeks, thought to be resolved. ROM at delivery, fluid clear. Infant vigorous with good spontaneous cry and tone. Needed only minimal bulb suctioning. Ap 9/9. Lungs clear to ausc in DR. Spoke with parents about risk for hypothermia and hypoglycemia in this infant. To CN to care of Pediatrician.   Christine Souhristie C. Dangelo Guzzetta, MD

## 2014-10-01 NOTE — Anesthesia Procedure Notes (Signed)
Spinal Patient location during procedure: OR Start time: 10/01/2014 11:35 AM End time: 10/01/2014 11:40 AM Staffing Anesthesiologist: Felipe DroneJUDD, Buffie Herne JENNETTE Performed by: anesthesiologist  Preanesthetic Checklist Completed: patient identified, site marked, surgical consent, pre-op evaluation, timeout performed, IV checked, risks and benefits discussed and monitors and equipment checked Spinal Block Patient position: sitting Prep: ChloraPrep Patient monitoring: continuous pulse ox, blood pressure and heart rate Approach: midline Location: L3-4 Injection technique: single-shot Needle Needle type: Sprotte  Needle gauge: 24 G Needle length: 9 cm Assessment Sensory level: T4 Additional Notes Functioning IV was confirmed and monitors were applied. Sterile prep and drape, including hand hygiene, mask and sterile gloves were used. The patient was positioned and the spine was prepped. The skin was anesthetized with lidocaine.  Free flow of clear CSF was obtained prior to injecting local anesthetic into the CSF.  The spinal needle aspirated freely following injection.  The needle was carefully withdrawn.  The patient tolerated the procedure well. Consent was obtained prior to procedure with all questions answered and concerns addressed. Risks including but not limited to bleeding, infection, nerve damage, paralysis, failed block, inadequate analgesia, allergic reaction, high spinal, itching and headache were discussed and the patient wished to proceed.   Karie SchwalbeMary Emalia Witkop, MD

## 2014-10-01 NOTE — Lactation Note (Signed)
This note was copied from the chart of Christine Maryla MorrowRebecca Takemoto. Lactation Consultation Note  Patient Name: Christine Maryla MorrowRebecca Pauwels WUJWJ'XToday's Date: 10/01/2014 Reason for consult: Initial assessment;Infant < 6lbs Mom did not BF her 1st baby. She plans to breast/bottle with this baby. LC reviewed risk of early supplementation to BF success. Lots of basic teaching reviewed with Mom, tummy sizes discussed. Advised if Mom decides to supplement early she needs to BF with each feeding before giving supplements. Guidelines for supplementing with BF reviewed with Mom. Reviewed how to use curved tipped syringe but advised Mom if she chooses to supplement here using curved tipped syringe, call for assist with 1st supplement. Reviewed with Mom the importance of baby being to the breast each feeding to encouraged milk production and protect milk supply. Advised to BF with feeding ques but if she does not observe feeding ques by 3 hours from the last feeding, place baby STS and see if she will BF. Lactation brochure left for review, advised of OP services and support group. Encouraged to call for assist as needed with latch.   Maternal Data Has patient been taught Hand Expression?: Yes Does the patient have breastfeeding experience prior to this delivery?: No  Feeding Feeding Type: Breast Fed Length of feed: 1 min  LATCH Score/Interventions Latch: Grasps breast easily, tongue down, lips flanged, rhythmical sucking.  Audible Swallowing: A few with stimulation  Type of Nipple: Everted at rest and after stimulation  Comfort (Breast/Nipple): Soft / non-tender     Hold (Positioning): Assistance needed to correctly position infant at breast and maintain latch.  LATCH Score: 8  Lactation Tools Discussed/Used WIC Program: Yes   Consult Status Consult Status: Follow-up Date: 10/02/14 Follow-up type: In-patient    Christine Beck, Christine Beck 10/01/2014, 5:10 PM

## 2014-10-01 NOTE — Transfer of Care (Signed)
Immediate Anesthesia Transfer of Care Note  Patient: Christine MorrowRebecca Beck  Procedure(s) Performed: Procedure(s): REPEAT CESAREAN SECTION (N/A)  Patient Location: PACU  Anesthesia Type:Spinal  Level of Consciousness: awake, alert  and oriented  Airway & Oxygen Therapy: Patient Spontanous Breathing  Post-op Assessment: Report given to PACU RN and Post -op Vital signs reviewed and stable  Post vital signs: Reviewed and stable  Complications: No apparent anesthesia complications

## 2014-10-02 ENCOUNTER — Encounter (HOSPITAL_COMMUNITY): Payer: Self-pay | Admitting: Obstetrics and Gynecology

## 2014-10-02 LAB — CBC
HCT: 30 % — ABNORMAL LOW (ref 36.0–46.0)
Hemoglobin: 10.3 g/dL — ABNORMAL LOW (ref 12.0–15.0)
MCH: 34.2 pg — AB (ref 26.0–34.0)
MCHC: 34.3 g/dL (ref 30.0–36.0)
MCV: 99.7 fL (ref 78.0–100.0)
PLATELETS: 162 10*3/uL (ref 150–400)
RBC: 3.01 MIL/uL — ABNORMAL LOW (ref 3.87–5.11)
RDW: 12.8 % (ref 11.5–15.5)
WBC: 8.7 10*3/uL (ref 4.0–10.5)

## 2014-10-02 NOTE — Addendum Note (Signed)
Addendum  created 10/02/14 1332 by Shanon PayorSuzanne M Gean Larose, CRNA   Modules edited: Notes Section   Notes Section:  File: [id number]

## 2014-10-02 NOTE — Progress Notes (Signed)
Subjective: Postpartum Day 1: Cesarean Delivery due to repeat/request Patient up ad lib, reports no syncope or dizziness. Feeding:  breast Contraceptive plan:  unsure  Objective: Vital signs in last 24 hours: Temp:  [97.5 F (36.4 C)-98.3 F (36.8 C)] 98.3 F (36.8 C) (01/27 1130) Pulse Rate:  [57-80] 80 (01/27 1130) Resp:  [10-20] 18 (01/27 1130) BP: (84-99)/(40-79) 84/48 mmHg (01/27 1130) SpO2:  [94 %-98 %] 97 % (01/27 1130)  Physical Exam:  General: alert and cooperative Lochia: appropriate Uterine Fundus: firm Abdomen:  + bowel sounds, non distended Incision: slight clear drainage present on honey comb dsg  Pressure dressing CDI DVT Evaluation: No evidence of DVT seen on physical exam. Homan's sign: Negative   Recent Labs  09/30/14 0945 10/02/14 0550  HGB 11.5* 10.3*  HCT 34.3* 30.0*  WBC 10.1 8.7    Assessment: Status post Cesarean section day 1. Doing well postoperatively.  Presser dressing in place  CDI, old drainage on honey comb dressing Anemia - hemodynamicly stable.    Plan: Continue current care. Breastfeeding and Lactation consult  OK to shower Remove outer dressing Dr. Su Hiltoberts updated on patient status   Christine Beck, CNM, MSN 10/02/2014. 2:20 PM

## 2014-10-02 NOTE — Lactation Note (Signed)
This note was copied from the chart of Christine Maryla MorrowRebecca Beck. Lactation Consultation Note  Patient Name: Christine Maryla MorrowRebecca Beck ZOXWR'UToday's Date: 10/02/2014 Reason for consult: Follow-up assessment;Infant < 6lbs Called to assist Mom with latch. [facility]. Baby sleepy at the breast demonstrating some nutritive and non-nutritive suckling. 1-2 swallows noted. LC assisted Mom with positioning to obtain more depth with latch. Lots of breast massage and stimulation to keep baby active at the breast. No jitteriness noted with this feeding. Encouraged Mom to BF with feeding ques but at least every 3 hours. Keep baby actively nursing for 15-20 minutes both breasts when possible. If baby becomes jittery notify RN to re-check blood sugar. If jitteriness returns or baby has another low blood sugar, Mom should supplement after feedings and start to post pump. Encouraged to call as needed for assist with latch, continuing working on depth with latch.   Maternal Data    Feeding Feeding Type: Breast Fed Length of feed: 25 min  LATCH Score/Interventions Latch: Grasps breast easily, tongue down, lips flanged, rhythmical sucking.  Audible Swallowing: A few with stimulation  Type of Nipple: Everted at rest and after stimulation  Comfort (Breast/Nipple): Filling, red/small blisters or bruises, mild/mod discomfort  Problem noted: Mild/Moderate discomfort Interventions (Mild/moderate discomfort): Comfort gels  Hold (Positioning): Assistance needed to correctly position infant at breast and maintain latch. Intervention(s): Breastfeeding basics reviewed;Support Pillows;Position options;Skin to skin  LATCH Score: 7  Lactation Tools Discussed/Used     Consult Status Consult Status: Follow-up Date: 10/03/14 Follow-up type: In-patient    [name], Pressley Tadesse Ann 10/02/2014, 4:21 PM
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
## 2014-10-02 NOTE — Anesthesia Postprocedure Evaluation (Signed)
  Anesthesia Post-op Note  Patient: Christine MorrowRebecca Beck  Procedure(s) Performed: Procedure(s): REPEAT CESAREAN SECTION (N/A)  Patient Location: Mother/Baby  Anesthesia Type:Spinal  Level of Consciousness: awake, alert  and oriented  Airway and Oxygen Therapy: Patient Spontanous Breathing  Post-op Pain: none  Post-op Assessment: Post-op Vital signs reviewed, Patient's Cardiovascular Status Stable, No headache, No backache, No residual numbness and No residual motor weakness  Post-op Vital Signs: Reviewed and stable  Last Vitals:  Filed Vitals:   10/02/14 1130  BP: 84/48  Pulse: 80  Temp: 36.8 C  Resp: 18    Complications: No apparent anesthesia complications

## 2014-10-02 NOTE — Lactation Note (Signed)
This note was copied from the chart of Christine Maryla MorrowRebecca Beck. Lactation Consultation Note  Patient Name: Christine Maryla MorrowRebecca Beck NGEXB'MToday's Date: 10/02/2014 Reason for consult: Follow-up assessment;Infant < 6lbs Baby starting to act like she wants to cluster feed. Awake and rooting. Slight jitteriness noted with FOB holding baby. No jitteriness when at the breast or after the feeding. Mom aware to notify RN if jitteriness returns. Reviewed cluster feeding with Mom. Advised baby should be at the breast 8-12 times in 24 hours and with feeding ques.  Mom wearing comfort gels for sore nipples, nipples red, no cracking or bleeding observed. Stressed to [employer]ES[employer] InternationalMom importance of deep latch to milk transfer and to prevent trauma on nipples. Advised to call for assist as needed.   Maternal Data    Feeding Feeding Type: Breast Fed Length of feed: 15 min  LATCH Score/Interventions Latch: Grasps breast easily, tongue down, lips flanged, rhythmical sucking.  Audible Swallowing: A few with stimulation  Type of Nipple: Everted at rest and after stimulation  Comfort (Breast/Nipple): Filling, red/small blisters or bruises, mild/mod discomfort  Problem noted: Mild/Moderate discomfort Interventions (Mild/moderate discomfort): Comfort gels  Hold (Positioning): No assistance needed to correctly position infant at breast. Intervention(s): Breastfeeding basics reviewed;Support Pillows;Position options;Skin to skin  LATCH Score: 8  Lactation Tools Discussed/Used     Consult Status Consult Status: Follow-up Date: 10/03/14 Follow-up type: In-patient    [name], [name] 10/02/2014, 4:44 PM

## 2014-10-03 ENCOUNTER — Encounter (HOSPITAL_COMMUNITY): Payer: Self-pay | Admitting: *Deleted

## 2014-10-03 MED ORDER — ALPRAZOLAM 0.5 MG PO TABS
1.0000 mg | ORAL_TABLET | Freq: Two times a day (BID) | ORAL | Status: DC | PRN
  Administered 2014-10-03 (×2): 0.5 mg via ORAL
  Administered 2014-10-04: 1 mg via ORAL
  Filled 2014-10-03 (×2): qty 2

## 2014-10-03 MED ORDER — ESCITALOPRAM OXALATE 10 MG PO TABS
10.0000 mg | ORAL_TABLET | Freq: Every day | ORAL | Status: DC
  Administered 2014-10-03 – 2014-10-04 (×2): 10 mg via ORAL
  Filled 2014-10-03 (×2): qty 1

## 2014-10-03 NOTE — Progress Notes (Addendum)
CSW completed follow up visit with the [name], and spent approximately 30 minutes with her.   CSW continued to provide brief therapy in order to assist her to process thoughts and feelings secondary to her relationship with the FOB.  [name] reflected upon her visit with the FOB on 1/27 and her ongoing frustrations since she is perceiving him as unsupportive.  [name] presents with insight on negative outcomes/consequences of maintaining a relationship with him, and what may continue to occur if she continues the relationship with him. Her comments indicate that she is preparing to end the relationship since she does not like the ongoing stress, sadness, and frustration that she believes is related to being in a relationship with him, and is only able to identify positive outcomes if she ends the relationship.    CSW provided the [name] with update [place].  CSW shared that her worker has reported positive behavioral changes on behalf of the [name].  She became tearful as she heard positive feedback, and shared that she often works hard without recognition, and is just appreciate that someone is aware of how hard she is working.   CSW continued to explore with the [name] her mental health symptoms and needs.  She presents with insight on need to re-start her medications.  CSW provided [name] with referral [facility] for [facility] providers, and she expressed appreciation.  CSW encouraged [name] to speak with her provider prior to discharge to inquire about ability to re-start her medications.  [name] reported intention and motivation.  There continue to be no barriers to discharge.  CSW will follow up with [name] if needs arise or upon [name] request.

## 2014-10-03 NOTE — Progress Notes (Signed)
Christine MorrowRebecca Beck [id number]  Subjective: Postpartum Day 2: Repeat C/S  Patient up ad lib, reports no syncope or dizziness.  Having some issues with depression and anxiety--has been off Lexapro 10 mg daily and Xanax 1 mg prn during pregnancy, would like to restart. Feeding:  Breast, but baby losing weight, so supplementing Contraceptive plan:  Undecided.  Seen by SW yesterday, with information on referral to mental health services.  [place] [facility] due to recent return of custody of 28 yo to patient, with plan for f/u next month.  Patient plans to establish with a counselor after discharge.    Objective: Temp:  [97.9 F (36.6 C)-98.3 F (36.8 C)] 97.9 F (36.6 C) (01/28 0534) Pulse Rate:  [69-80] 69 (01/28 0534) Resp:  [17-18] 17 (01/28 0534) BP: (84-99)/(48-69) 99/58 mmHg (01/28 0534) SpO2:  [97 %] 97 % (01/27 1130)   Recent Labs  10/02/14 0550  HGB 10.3*  HCT 30.0*  WBC 8.7    Physical Exam:  General: alert Lochia: appropriate Uterine Fundus: firm Abdomen:  + bowel sounds, mild gaseous distension Incision: Honeycomb dressing CDI--was replaced yesterday due to soiling DVT Evaluation: No evidence of DVT seen on physical exam. Negative Homan's sign.   Assessment/Plan: Status post cesarean delivery, day 2 Hx anxiety/depression--wants to restart previous meds Stable Continue current care. Lexapro 10 mg daily, Xanax 1 mg BID prn--will need Rxs for same on d/c. Plan d/c tomorrow. Support to patient for issues. [facility].    Nigel BridgemanLATHAM, Rolande Moe MSN, CNM 10/03/2014, 11:22 AM

## 2014-10-03 NOTE — Lactation Note (Signed)
This note was copied from the chart of Girl Maryla MorrowRebecca Aden. Lactation Consultation Note; Mom reports she has been supplementing with formula due to weight loss. Baby just had formula. Has DEBP in room and reports she has pumped once with it. Only obtained drops of EBM. Encouragement given. Suggested trying hand expression after pumping to obtain more milk. Reviewed cleaning of pump pieces. Reports nipples are sore- using comfort gels for them. Encouraged to call for assist with latch. No questions at present.   Patient Name: Girl Maryla MorrowRebecca Daggs ZOXWR'UToday's Date: 10/03/2014 Reason for consult: Follow-up assessment   Maternal Data Formula Feeding for Exclusion: No Has patient been taught Hand Expression?: Yes (pt states her RN taught her how to do it)  Feeding Feeding Type: Formula Nipple Type: Slow - flow  LATCH Score/Interventions                      Lactation Tools Discussed/Used     Consult Status Consult Status: Follow-up Date: 10/04/14 Follow-up type: In-patient    Pamelia HoitWeeks, Davina Howlett D 10/03/2014, 1:33 PM

## 2014-10-03 NOTE — Lactation Note (Signed)
This note was copied from the chart of Christine Beck. Lactation Consultation Note Mom sleeping, in cradle position baby Actively BF. Mom in semi fowlers position and baby's face sinking into moms breast w/nose twisted. Whispered mom, & Elevated HOB slightly. Mom woke up startled. Asked how long baby had been BF, stated all night. Explained cluster feeding. Discussed 9% weight loss and need to supplement. Asked if she could express colostrum, said a drop so just give formula. Explained breast massage during BF baby, it would help express more colostrum to baby and get up to 50% more. Gave Similac 22 cal. neosure d/t weight < 6 lbs. Gave feeding sheet information and measuring cups to feed amount according to hours of age. Assessed baby's suck w/gloved finger. Noted high palate, upper lip labial frenulum. Had good rhythm suckle but not strong. Baby does have asymmetrical facial features and swelling in nasal area. Discussed BF first then supplementing, and breast massage. Mom has sore nipples wearing comfort gels. No redness noted, but states tender. She is rubbing the drop of colostrum on her nipples.  Patient Name: Christine Beck EAVWU'JToday's Date: 10/03/2014 Reason for consult: Follow-up assessment;Infant weight loss;Infant < 6lbs   Maternal Data    Feeding Feeding Type: Formula Nipple Type: Slow - flow Length of feed: 30 min  LATCH Score/Interventions Latch: Grasps breast easily, tongue down, lips flanged, rhythmical sucking. Intervention(s): Assist with latch  Audible Swallowing: None Intervention(s): Alternate breast massage;Hand expression  Type of Nipple: Everted at rest and after stimulation  Comfort (Breast/Nipple): Filling, red/small blisters or bruises, mild/mod discomfort  Problem noted: Mild/Moderate discomfort Interventions (Mild/moderate discomfort): Comfort gels;Hand expression;Hand massage  Hold (Positioning): No assistance needed to correctly position infant at  breast. Intervention(s): Support Pillows;Position options  LATCH Score: 7  Lactation Tools Discussed/Used Tools: Comfort gels;Bottle   Consult Status Consult Status: Follow-up Date: 10/03/14 Follow-up type: In-patient    [name], [name] 10/03/2014, 3:22 AM

## 2014-10-04 MED ORDER — IBUPROFEN 600 MG PO TABS: 600.0000 mg | ORAL_TABLET | Freq: Four times a day (QID) | ORAL | Status: DC

## 2014-10-04 MED ORDER — SENNOSIDES-DOCUSATE SODIUM 8.6-50 MG PO TABS: 2.0000 | ORAL_TABLET | Freq: Every day | ORAL | Status: AC

## 2014-10-04 MED ORDER — ALPRAZOLAM 1 MG PO TABS: 1.0000 mg | ORAL_TABLET | Freq: Two times a day (BID) | ORAL | Status: DC | PRN

## 2014-10-04 MED ORDER — ESCITALOPRAM OXALATE 10 MG PO TABS: 10.0000 mg | ORAL_TABLET | Freq: Every day | ORAL | Status: DC

## 2014-10-04 MED ORDER — OXYCODONE-ACETAMINOPHEN 5-325 MG PO TABS: 1.0000 | ORAL_TABLET | Freq: Four times a day (QID) | ORAL | Status: DC | PRN

## 2014-10-04 MED ORDER — MEDROXYPROGESTERONE ACETATE 150 MG/ML IM SUSP
150.0000 mg | Freq: Once | INTRAMUSCULAR | Status: AC
  Administered 2014-10-04: 150 mg via INTRAMUSCULAR
  Filled 2014-10-04: qty 1

## 2014-10-04 MED ORDER — SIMETHICONE 80 MG PO CHEW: 80.0000 mg | CHEWABLE_TABLET | ORAL | Status: AC

## 2014-10-04 NOTE — Progress Notes (Signed)
Ur chart review completed.  

## 2014-10-04 NOTE — Discharge Instructions (Signed)
Postpartum Depression and Baby Blues °The postpartum period begins right after the birth of a baby. During this time, there is often a great amount of joy and excitement. It is also a time of many changes in the life of the parents. Regardless of how many times a mother gives birth, each child brings new challenges and dynamics to the family. It is not unusual to have feelings of excitement along with confusing shifts in moods, emotions, and thoughts. All mothers are at risk of developing postpartum depression or the "baby blues." These mood changes can occur right after giving birth, or they may occur many months after giving birth. The baby blues or postpartum depression can be mild or severe. Additionally, postpartum depression can go away rather quickly, or it can be a long-term condition.  °CAUSES °Raised hormone levels and the rapid drop in those levels are thought to be a main cause of postpartum depression and the baby blues. A number of hormones change during and after pregnancy. Estrogen and progesterone usually decrease right after the delivery of your baby. The levels of thyroid hormone and various cortisol steroids also rapidly drop. Other factors that play a role in these mood changes include major life events and genetics.  °RISK FACTORS °If you have any of the following risks for the baby blues or postpartum depression, know what symptoms to watch out for during the postpartum period. Risk factors that may increase the likelihood of getting the baby blues or postpartum depression include: °· Having a personal or family history of depression.   °· Having depression while being pregnant.   °· Having premenstrual mood issues or mood issues related to oral contraceptives. °· Having a lot of life stress.   °· Having marital conflict.   °· Lacking a social support network.   °· Having a baby with special needs.   °· Having health problems, such as diabetes.   °SIGNS AND SYMPTOMS °Symptoms of baby blues  include: °· Brief changes in mood, such as going from extreme happiness to sadness. °· Decreased concentration.   °· Difficulty sleeping.   °· Crying spells, tearfulness.   °· Irritability.   °· Anxiety.   °Symptoms of postpartum depression typically begin within the first month after giving birth. These symptoms include: °· Difficulty sleeping or excessive sleepiness.   °· Marked weight loss.   °· Agitation.   °· Feelings of worthlessness.   °· Lack of interest in activity or food.   °Postpartum psychosis is a very serious condition and can be dangerous. Fortunately, it is rare. Displaying any of the following symptoms is cause for immediate medical attention. Symptoms of postpartum psychosis include:  °· Hallucinations and delusions.   °· Bizarre or disorganized behavior.   °· Confusion or disorientation.   °DIAGNOSIS  °A diagnosis is made by an evaluation of your symptoms. There are no medical or lab tests that lead to a diagnosis, but there are various questionnaires that a health care provider may use to identify those with the baby blues, postpartum depression, or psychosis. Often, a screening tool called the Edinburgh Postnatal Depression Scale is used to diagnose depression in the postpartum period.  °TREATMENT °The baby blues usually goes away on its own in 1-2 weeks. Social support is often all that is needed. You will be encouraged to get adequate sleep and rest. Occasionally, you may be given medicines to help you sleep.  °Postpartum depression requires treatment because it can last several months or longer if it is not treated. Treatment may include individual or group therapy, medicine, or both to address any social, physiological, and psychological   factors that may play a role in the depression. Regular exercise, a healthy diet, rest, and social support may also be strongly recommended.  Postpartum psychosis is more serious and needs treatment right away. Hospitalization is often needed. HOME CARE  INSTRUCTIONS  Get as much rest as you can. Nap when the baby sleeps.   Exercise regularly. Some women find yoga and walking to be beneficial.   Eat a balanced and nourishing diet.   Do little things that you enjoy. Have a cup of tea, take a bubble bath, read your favorite magazine, or listen to your favorite music.  Avoid alcohol.   Ask for help with household chores, cooking, grocery shopping, or running errands as needed. Do not try to do everything.   Talk to people close to you about how you are feeling. Get support from your partner, family members, friends, or other new moms.  Try to stay positive in how you think. Think about the things you are grateful for.   Do not spend a lot of time alone.   Only take over-the-counter or prescription medicine as directed by your health care provider.  Keep all your postpartum appointments.   Let your health care provider know if you have any concerns.  SEEK MEDICAL CARE IF: You are having a reaction to or problems with your medicine. SEEK IMMEDIATE MEDICAL CARE IF:  You have suicidal feelings.   You think you may harm the baby or someone else. MAKE SURE YOU:  Understand these instructions.  Will watch your condition.  Will get help right away if you are not doing well or get worse. Document Released: 05/27/2004 Document Revised: 08/28/2013 Document Reviewed: 06/04/2013 [facility] Patient Information 2015 [place], [place]. This information is not intended to replace advice given to you by your health care provider. Make sure you discuss any questions you have with your health care provider. Etonogestrel implant What is this medicine? ETONOGESTREL (et oh noe JES trel) is a contraceptive (birth control) device. It is used to prevent pregnancy. It can be used for up to 3 years. This medicine may be used for other purposes; ask your health care provider or pharmacist if you have questions. COMMON BRAND NAME(S): Implanon,  Nexplanon What should I tell my health care provider before I take this medicine? They need to know if you have any of these conditions: -abnormal vaginal bleeding -blood vessel disease or blood clots -cancer of the breast, cervix, or liver -depression -diabetes -gallbladder disease -headaches -heart disease or recent heart attack -high blood pressure -high cholesterol -kidney disease -liver disease -renal disease -seizures -tobacco smoker -an unusual or allergic reaction to etonogestrel, other hormones, anesthetics or antiseptics, medicines, foods, dyes, or preservatives -pregnant or trying to get pregnant -breast-feeding How should I use this medicine? This device is inserted just under the skin on the inner side of your upper arm by a health care professional. Talk to your pediatrician regarding the use of this medicine in children. Special care may be needed. Overdosage: If you think you've taken too much of this medicine contact a poison control center or emergency room at once. Overdosage: If you think you have taken too much of this medicine contact a poison control center or emergency room at once. NOTE: This medicine is only for you. Do not share this medicine with others. What if I miss a dose? This does not apply. What may interact with this medicine? Do not take this medicine with any of the following medications: -amprenavir -bosentan -  fosamprenavir This medicine may also interact with the following medications: -barbiturate medicines for inducing sleep or treating seizures -certain medicines for fungal infections like ketoconazole and itraconazole -griseofulvin -medicines to treat seizures like carbamazepine, felbamate, oxcarbazepine, phenytoin, topiramate -modafinil -phenylbutazone -rifampin -some medicines to treat HIV infection like atazanavir, indinavir, lopinavir, nelfinavir, tipranavir, ritonavir -St. John's wort This list may not describe all possible  interactions. Give your health care provider a list of all the medicines, herbs, non-prescription drugs, or dietary supplements you use. Also tell them if you smoke, drink alcohol, or use illegal drugs. Some items may interact with your medicine. What should I watch for while using this medicine? This product does not protect you against HIV infection (AIDS) or other sexually transmitted diseases. You should be able to feel the implant by pressing your fingertips over the skin where it was inserted. Tell your doctor if you cannot feel the implant. What side effects may I notice from receiving this medicine? Side effects that you should report to your doctor or health care professional as soon as possible: -allergic reactions like skin rash, itching or hives, swelling of the face, lips, or tongue -breast lumps -changes in vision -confusion, trouble speaking or understanding -dark urine -depressed mood -general ill feeling or flu-like symptoms -light-colored stools -loss of appetite, nausea -right upper belly pain -severe headaches -severe pain, swelling, or tenderness in the abdomen -shortness of breath, chest pain, swelling in a leg -signs of pregnancy -sudden numbness or weakness of the face, arm or leg -trouble walking, dizziness, loss of balance or coordination -unusual vaginal bleeding, discharge -unusually weak or tired -yellowing of the eyes or skin Side effects that usually do not require medical attention (Report these to your doctor or health care professional if they continue or are bothersome.): -acne -breast pain -changes in weight -cough -fever or chills -headache -irregular menstrual bleeding -itching, burning, and vaginal discharge -pain or difficulty passing urine -sore throat This list may not describe all possible side effects. Call your doctor for medical advice about side effects. You may report side effects to FDA at 1-800-FDA-1088. Where should I keep my  medicine? This drug is given in a hospital or clinic and will not be stored at home. NOTE: This sheet is a summary. It may not cover all possible information. If you have questions about this medicine, talk to your doctor, pharmacist, or health care provider.  2015, Elsevier/Gold Standard. (2012-02-28 15:37:45) Medroxyprogesterone injection [Contraceptive] What is this medicine? MEDROXYPROGESTERONE (me DROX ee proe JES te rone) contraceptive injections prevent pregnancy. They provide effective birth control for 3 months. Depo-subQ Provera 104 is also used for treating pain related to endometriosis. This medicine may be used for other purposes; ask your health care provider or pharmacist if you have questions. COMMON BRAND NAME(S): Depo-Provera, Depo-subQ Provera 104 What should I tell my health care provider before I take this medicine? They need to know if you have any of these conditions: -frequently drink alcohol -asthma -blood vessel disease or a history of a blood clot in the lungs or legs -bone disease such as osteoporosis -breast cancer -diabetes -eating disorder (anorexia nervosa or bulimia) -high blood pressure -HIV infection or AIDS -kidney disease -liver disease -mental depression -migraine -seizures (convulsions) -stroke -tobacco smoker -vaginal bleeding -an unusual or allergic reaction to medroxyprogesterone, other hormones, medicines, foods, dyes, or preservatives -pregnant or trying to get pregnant -breast-feeding How should I use this medicine? Depo-Provera Contraceptive injection is given into a muscle. Depo-subQ Provera 104 injection is  given under the skin. These injections are given by a health care professional. You must not be pregnant before getting an injection. The injection is usually given during the first 5 days after the start of a menstrual period or 6 weeks after delivery of a baby. Talk to your pediatrician regarding the use of this medicine in  children. Special care may be needed. These injections have been used in female children who have started having menstrual periods. Overdosage: If you think you have taken too much of this medicine contact a poison control center or emergency room at once. NOTE: This medicine is only for you. Do not share this medicine with others. What if I miss a dose? Try not to miss a dose. You must get an injection once every 3 months to maintain birth control. If you cannot keep an appointment, call and reschedule it. If you wait longer than 13 weeks between Depo-Provera contraceptive injections or longer than 14 weeks between Depo-subQ Provera 104 injections, you could get pregnant. Use another method for birth control if you miss your appointment. You may also need a pregnancy test before receiving another injection. What may interact with this medicine? Do not take this medicine with any of the following medications: -bosentan This medicine may also interact with the following medications: -aminoglutethimide -antibiotics or medicines for infections, especially rifampin, rifabutin, rifapentine, and griseofulvin -aprepitant -barbiturate medicines such as phenobarbital or primidone -bexarotene -carbamazepine -medicines for seizures like ethotoin, felbamate, oxcarbazepine, phenytoin, topiramate -modafinil -St. John's wort This list may not describe all possible interactions. Give your health care provider a list of all the medicines, herbs, non-prescription drugs, or dietary supplements you use. Also tell them if you smoke, drink alcohol, or use illegal drugs. Some items may interact with your medicine. What should I watch for while using this medicine? This drug does not protect you against HIV infection (AIDS) or other sexually transmitted diseases. Use of this product may cause you to lose calcium from your bones. Loss of calcium may cause weak bones (osteoporosis). Only use this product for more than 2  years if other forms of birth control are not right for you. The longer you use this product for birth control the more likely you will be at risk for weak bones. Ask your health care professional how you can keep strong bones. You may have a change in bleeding pattern or irregular periods. Many females stop having periods while taking this drug. If you have received your injections on time, your chance of being pregnant is very low. If you think you may be pregnant, see your health care professional as soon as possible. Tell your health care professional if you want to get pregnant within the next year. The effect of this medicine may last a long time after you get your last injection. What side effects may I notice from receiving this medicine? Side effects that you should report to your doctor or health care professional as soon as possible: -allergic reactions like skin rash, itching or hives, swelling of the face, lips, or tongue -breast tenderness or discharge -breathing problems -changes in vision -depression -feeling faint or lightheaded, falls -fever -pain in the abdomen, chest, groin, or leg -problems with balance, talking, walking -unusually weak or tired -yellowing of the eyes or skin Side effects that usually do not require medical attention (report to your doctor or health care professional if they continue or are bothersome): -acne -fluid retention and swelling -headache -irregular periods, spotting, or absent  periods -temporary pain, itching, or skin reaction at site where injected -weight gain This list may not describe all possible side effects. Call your doctor for medical advice about side effects. You may report side effects to FDA at 1-800-FDA-1088. Where should I keep my medicine? This does not apply. The injection will be given to you by a health care professional. NOTE: This sheet is a summary. It may not cover all possible information. If you have questions about  this medicine, talk to your doctor, pharmacist, or health care provider.  2015, Elsevier/Gold Standard. (2008-09-13 18:37:56)

## 2014-10-04 NOTE — Discharge Summary (Signed)
Cesarean Section Delivery Discharge Summary  Christine Beck  DOB:    1986-06-04 MRN:    [id number] CSN:    [id number]  Date of admission:                  Oct 01, 2014  Date of discharge:                   Oct 04, 2014  Procedures this admission: Repeat C/S   Date of Delivery: Oct 01, 2014  Newborn Data:  Live born female  Birth Weight: 5 lb 15.2 oz (2700 g) APGAR: 9, 9  Home with mother. Name:  Circumcision Plan: None  History of Present Illness:  Ms. Christine Beck is a 29 y.o. female, [phone], who presents at 238w0d weeks gestation. The patient [facility].    Her pregnancy has been complicated by:  Patient Active Problem List   Diagnosis Date Noted  . S/P cesarean section 10/01/2014  . Positive GBS test 09/25/2014  . Generalized anxiety disorder 08/31/2014  . Chronic depression 08/31/2014  . Previous cesarean delivery, antepartum condition or complication 08/31/2014  . Imprisonment--06/2013-11/2013 08/31/2014  . Social problem--mother killed in fire 2014, father with terminal lung CA, patient in custody battle  08/31/2014  . ADD (attention deficit disorder) 08/31/2014  . IUGR (intrauterine growth restriction) affecting care of mother, hx abnormal dopplers--normal growth and dopplers at 37 weeks.      Hospital Course--Scheduled Cesarean: Patient was admitted on Jan 26 for a scheduled repeat cesarean delivery.   She was taken to the operating room, where Dr. Audree CamelN. Dillard performed a repeat LTCS under spinal anesthesia, with delivery of a viable female, with weight and Apgars as listed below. Infant was in good condition and remained at the patient's bedside.  The patient was taken to recovery in good condition.  Patient planned to breast feed.  On post-op day 1, patient was doing well, tolerating a regular diet, with Hgb of 10.3.  Throughout her stay, her physical exam was WNL, her  incision was CDI, and her vital signs remained stable.  By post-op day 3, she was up ad lib, tolerating a regular diet, with good pain control with po med.  She was deemed to have received the full benefit of her hospital stay, and was discharged home in stable condition.  Contraceptive choice was depo provera prior to discharge and nexplanon insertion.    Feeding:  breast  Contraception:  Depo-Provera, Nexplanon  Discharge hemoglobin:  HEMOGLOBIN  Date Value Ref Range Status  10/02/2014 10.3* 12.0 - 15.0 g/dL Final  13/08/657801/25/2016 46.911.5* 12.0 - 15.0 g/dL Final   HCT  Date Value Ref Range Status  10/02/2014 30.0* 36.0 - 46.0 % Final  09/30/2014 34.3* 36.0 - 46.0 % Final   WBC  Date Value Ref Range Status  10/02/2014 8.7 4.0 - 10.5 K/uL Final  09/30/2014 10.1 4.0 - 10.5 K/uL Final    Discharge Physical Exam:   General: alert, cooperative, no distress and pale  Chest: Lungs CTA, Heart RRR Abdomen:  + bowel sounds, soft, appropriately tender, +Distention Uterine Fundus: firm at U/-1 Incision: Honeycomb dressing CDILochia: appropriate DVT Evaluation: No evidence of DVT seen on physical exam. No cords or calf tenderness. No significant calf/ankle edema.  Intrapartum Procedures: cesarean: low cervical, transverse Postpartum Procedures: none Complications-Operative and Postpartum: none  Discharge Diagnoses: Term Pregnancy-delivered and Repeat C/S  Discharge Information:  Activity:  pelvic rest Diet:                routine Medications: PNV, Ibuprofen, Colace, Percocet and Vicodin Condition:      stable Instructions: Incision care, Postpartum Depression, Activity Restrictions, Breastfeeding, Who and when to call for PP Concerns, Follow Up Appointment in 2 weeks for Emotional Assessment  Discharge to: home  Follow-up Information    Follow up with [facility] In 2 weeks.   Specialty:  Obstetrics and Gynecology   Why:  Please call if you  have any questions or concerns prior to your next visit. Remove honey comb dressing in 2-3 days and steri-strips in 7 days if applicable.   Contact information:   [address] [place] [address] [phone]       Marlene Bast MSN, CNM 10/04/2014 10:11 AM

## 2015-01-01 ENCOUNTER — Encounter (HOSPITAL_COMMUNITY): Payer: Self-pay | Admitting: Emergency Medicine

## 2015-01-01 ENCOUNTER — Inpatient Hospital Stay (HOSPITAL_COMMUNITY)
Admission: AD | Admit: 2015-01-01 | Discharge: 2015-01-03 | DRG: 885 | Disposition: A | Discharge status: Home / Self Care | Payer: Medicaid Other | Source: Intra-hospital | Attending: Psychiatry | Admitting: Psychiatry

## 2015-01-01 ENCOUNTER — Emergency Department (HOSPITAL_COMMUNITY)
Admission: EM | Admit: 2015-01-01 | Discharge: 2015-01-01 | Disposition: A | Discharge status: Other Acute Inpatient Hospital | Payer: Medicaid Other | Attending: Emergency Medicine | Admitting: Emergency Medicine

## 2015-01-01 DIAGNOSIS — Y908 Blood alcohol level of 240 mg/100 ml or more: Secondary | ICD-10-CM | POA: Diagnosis present

## 2015-01-01 DIAGNOSIS — F411 Generalized anxiety disorder: Secondary | ICD-10-CM | POA: Diagnosis present

## 2015-01-01 DIAGNOSIS — O99345 Other mental disorders complicating the puerperium: Secondary | ICD-10-CM

## 2015-01-01 DIAGNOSIS — F10129 Alcohol abuse with intoxication, unspecified: Secondary | ICD-10-CM | POA: Diagnosis present

## 2015-01-01 DIAGNOSIS — T50901A Poisoning by unspecified drugs, medicaments and biological substances, accidental (unintentional), initial encounter: Secondary | ICD-10-CM | POA: Diagnosis not present

## 2015-01-01 DIAGNOSIS — Z87440 Personal history of urinary (tract) infections: Secondary | ICD-10-CM | POA: Diagnosis not present

## 2015-01-01 DIAGNOSIS — F53 Postpartum depression: Secondary | ICD-10-CM | POA: Diagnosis present

## 2015-01-01 DIAGNOSIS — Z79899 Other long term (current) drug therapy: Secondary | ICD-10-CM | POA: Diagnosis not present

## 2015-01-01 DIAGNOSIS — T424X1A Poisoning by benzodiazepines, accidental (unintentional), initial encounter: Secondary | ICD-10-CM | POA: Diagnosis present

## 2015-01-01 DIAGNOSIS — F1010 Alcohol abuse, uncomplicated: Secondary | ICD-10-CM

## 2015-01-01 DIAGNOSIS — F4310 Post-traumatic stress disorder, unspecified: Secondary | ICD-10-CM | POA: Diagnosis present

## 2015-01-01 DIAGNOSIS — Y9289 Other specified places as the place of occurrence of the external cause: Secondary | ICD-10-CM | POA: Insufficient documentation

## 2015-01-01 DIAGNOSIS — F331 Major depressive disorder, recurrent, moderate: Secondary | ICD-10-CM | POA: Diagnosis present

## 2015-01-01 DIAGNOSIS — D649 Anemia, unspecified: Secondary | ICD-10-CM | POA: Insufficient documentation

## 2015-01-01 DIAGNOSIS — Y998 Other external cause status: Secondary | ICD-10-CM | POA: Diagnosis not present

## 2015-01-01 DIAGNOSIS — Z801 Family history of malignant neoplasm of trachea, bronchus and lung: Secondary | ICD-10-CM | POA: Diagnosis not present

## 2015-01-01 DIAGNOSIS — T424X2A Poisoning by benzodiazepines, intentional self-harm, initial encounter: Secondary | ICD-10-CM | POA: Diagnosis not present

## 2015-01-01 DIAGNOSIS — F419 Anxiety disorder, unspecified: Secondary | ICD-10-CM | POA: Insufficient documentation

## 2015-01-01 DIAGNOSIS — F321 Major depressive disorder, single episode, moderate: Secondary | ICD-10-CM | POA: Diagnosis present

## 2015-01-01 DIAGNOSIS — F17210 Nicotine dependence, cigarettes, uncomplicated: Secondary | ICD-10-CM | POA: Diagnosis present

## 2015-01-01 DIAGNOSIS — Y9389 Activity, other specified: Secondary | ICD-10-CM | POA: Diagnosis not present

## 2015-01-01 DIAGNOSIS — T1491 Suicide attempt: Secondary | ICD-10-CM | POA: Diagnosis not present

## 2015-01-01 LAB — COMPREHENSIVE METABOLIC PANEL
ALBUMIN: 4 g/dL (ref 3.5–5.2)
ALT: 36 U/L — ABNORMAL HIGH (ref 0–35)
AST: 27 U/L (ref 0–37)
Alkaline Phosphatase: 121 U/L — ABNORMAL HIGH (ref 39–117)
Anion gap: 8 (ref 5–15)
BUN: 17 mg/dL (ref 6–23)
CO2: 21 mmol/L (ref 19–32)
CREATININE: 0.71 mg/dL (ref 0.50–1.10)
Calcium: 8.6 mg/dL (ref 8.4–10.5)
Chloride: 112 mmol/L (ref 96–112)
GFR calc Af Amer: >90 mL/min (ref >90)
GFR calc non Af Amer: >90 mL/min (ref >90)
Glucose, Bld: 117 mg/dL — ABNORMAL HIGH (ref 70–99)
POTASSIUM: 3.5 mmol/L (ref 3.5–5.1)
Sodium: 141 mmol/L (ref 135–145)
TOTAL PROTEIN: 7.1 g/dL (ref 6.0–8.3)
Total Bilirubin: 0.2 mg/dL — ABNORMAL LOW (ref 0.3–1.2)

## 2015-01-01 LAB — CBC
HEMATOCRIT: 40.9 % (ref 36.0–46.0)
Hemoglobin: 13.6 g/dL (ref 12.0–15.0)
MCH: 34 pg (ref 26.0–34.0)
MCHC: 33.3 g/dL (ref 30.0–36.0)
MCV: 102.3 fL — ABNORMAL HIGH (ref 78.0–100.0)
Platelets: 257 10*3/uL (ref 150–400)
RBC: 4 MIL/uL (ref 3.87–5.11)
RDW: 14 % (ref 11.5–15.5)
WBC: 7.2 10*3/uL (ref 4.0–10.5)

## 2015-01-01 LAB — ETHANOL: Alcohol, Ethyl (B): 319 mg/dL — ABNORMAL HIGH (ref 0–9)

## 2015-01-01 LAB — SALICYLATE LEVEL: Salicylate Lvl: <4 mg/dL (ref 2.8–20.0)

## 2015-01-01 LAB — RAPID URINE DRUG SCREEN, HOSP PERFORMED
Amphetamines: NOT DETECTED
BARBITURATES: NOT DETECTED
Benzodiazepines: POSITIVE — AB
Cocaine: NOT DETECTED
Opiates: NOT DETECTED
TETRAHYDROCANNABINOL: NOT DETECTED

## 2015-01-01 LAB — ACETAMINOPHEN LEVEL

## 2015-01-01 MED ORDER — GABAPENTIN 600 MG PO TABS
300.0000 mg | ORAL_TABLET | Freq: Every day | ORAL | Status: DC
  Filled 2015-01-01: qty 0.5

## 2015-01-01 MED ORDER — LORAZEPAM 1 MG PO TABS: 1.0000 mg | ORAL_TABLET | ORAL | Status: DC | PRN

## 2015-01-01 MED ORDER — IBUPROFEN 800 MG PO TABS
800.0000 mg | ORAL_TABLET | Freq: Once | ORAL | Status: AC
  Administered 2015-01-01: 800 mg via ORAL
  Filled 2015-01-01: qty 1

## 2015-01-01 MED ORDER — TRAZODONE HCL 50 MG PO TABS: 50.0000 mg | ORAL_TABLET | Freq: Every evening | ORAL | Status: DC | PRN

## 2015-01-01 MED ORDER — GABAPENTIN 300 MG PO CAPS
300.0000 mg | ORAL_CAPSULE | Freq: Every day | ORAL | Status: DC
  Administered 2015-01-01: 300 mg via ORAL
  Filled 2015-01-01: qty 1

## 2015-01-01 MED ORDER — DULOXETINE HCL 30 MG PO CPEP
30.0000 mg | ORAL_CAPSULE | Freq: Every day | ORAL | Status: DC
  Administered 2015-01-01: 30 mg via ORAL
  Filled 2015-01-01 (×2): qty 1

## 2015-01-01 MED ORDER — SODIUM CHLORIDE 0.9 % IV BOLUS (SEPSIS)
1000.0000 mL | Freq: Once | INTRAVENOUS | Status: AC
  Administered 2015-01-01: 1000 mL via INTRAVENOUS

## 2015-01-01 NOTE — ED Notes (Signed)
MD at bedside. 

## 2015-01-01 NOTE — ED Notes (Signed)
[facility].

## 2015-01-01 NOTE — ED Notes (Signed)
Pt on her first call for the day.

## 2015-01-01 NOTE — ED Notes (Signed)
Pt is agitated reported that she needs to not be in the hospital and has a ten week old baby. Pt is concerned about her family working and needing childcare. She is concerned about missing work.

## 2015-01-01 NOTE — ED Notes (Signed)
Pt's sister, Deanna ArtisKeisha, called and asked that the pt call her when she wakes up.

## 2015-01-01 NOTE — BH Assessment (Signed)
Attempted to assess patient but unfortunately she was unresponsive.Writer asked staff to notify TTS when she awakens and is alert enough to participate in a assessment.

## 2015-01-01 NOTE — BH Assessment (Signed)
[facility] Assessment Progress Note   Clinician talked to Dr. Denton LankSteinl about patient not being verbally responsive per Dr. Norman HerrlichSteinl's note.  He will withdraw the TTS consult order until pt is able to participate in interview.

## 2015-01-01 NOTE — ED Provider Notes (Signed)
11:04 AM I reassessed the patient at this time. She is now awake and alert. The patient remembers what happened last evening. She had gone to a tanning bed. When she came home to the house house was a mess. He lives with several other people. She became very upset and agitated because she felt like she had to clean everyone else's mess. The patient started drinking heavily last evening. She denies any other drugs or medications other than the ones that she is prescription for.   Patient states she got into an argument with one of her roommates who happens to be a Emergency planning/management officerpolice officer. He told her to calm down and she said that she did not want to.   The patient was given Haldol by EMS and has been sleeping in the emergency department. The patient denies any trouble with any chest pain or shortness of breath. She says she feels hungry now and wants something to eat or drink.   Patient's blood pressure remains in the low 90s. She has had some blood pressures in the 80s. Patient is otherwise stable. I suspect the hypotension may be related to her low baseline blood pressure on top of her alcohol ingestion. At this point I feel that she is medically stable. Awaiting psych eval.  Linwood DibblesJon Valta Dillon, MD 01/01/15 1106

## 2015-01-01 NOTE — ED Notes (Addendum)
Poison control states to watch for CNS depression, R/O tylenol and aspirin, fluids for hypotension (dopamine if needed).

## 2015-01-01 NOTE — ED Provider Notes (Addendum)
CSN: [id number]     Arrival date & time 01/01/15  0116 History   First MD Initiated Contact with Patient 01/01/15 0211     Chief Complaint  Patient presents with  . Drug Overdose     (Consider location/radiation/quality/duration/timing/severity/associated sxs/prior Treatment) Patient is a 29 y.o. female presenting with Overdose. The history is provided by the patient and the EMS personnel. The history is limited by the condition of the patient.  Drug Overdose  Patient w hx depression, anxiety, presents via ems w report worsening depression, SI, etoh and medication abuse ?overdose.  En route ems notes pt combative/belligerent, they gave haldol 5 mg.  On arrival to ED, pt appears sedated.  Pt not verbally responsive to questions or cooperative w exam - level 5 caveat.      Past Medical History  Diagnosis Date  . Infection     frequent UTIs  . Anxiety   . Depression   . Termination of pregnancy (fetus) x 2    2005, 2006 - both at 6 wks  . Anemia   . Heartburn in pregnancy   . Headache     otc med prn  . History of ADHD     dx as a child  . Former smoker, stopped smoking in distant past   . H/O drug abuse   . History of bipolar disorder    Past Surgical History  Procedure Laterality Date  . Cesarean section  2013    [place]  . Cesarean section N/A 10/01/2014    Procedure: REPEAT CESAREAN SECTION;  Surgeon: Jaymes Graff, MD;  Location: [facility];  Service: Obstetrics;  Laterality: N/A;   Family History  Problem Relation Age of Onset  . Cancer Father    History  Substance Use Topics  . Smoking status: Never Smoker   . Smokeless tobacco: Never Used  . Alcohol Use: No   OB History    Gravida Para Term Preterm AB TAB SAB Ectopic Multiple Living   0 2 2 0 0 0 2      Obstetric Comments   Mat and fetal heart rate bottomed out when on pit       Review of Systems  Unable to perform ROS: Patient unresponsive  level 5 caveat, overdose, psych illness,  unresponsiveness    Allergies  Abilify  Home Medications   Prior to Admission medications   Medication Sig Start Date End Date Taking? Authorizing Provider  ALPRAZolam Prudy Feeler) 1 MG tablet Take 1 tablet (1 mg total) by mouth 2 (two) times daily as needed for anxiety. 10/04/14  Yes Gerrit Heck, CNM  DULoxetine (CYMBALTA) 30 MG capsule Take 30 mg by mouth daily.   Yes Historical Provider, MD  acetaminophen (TYLENOL) 500 MG tablet Take 1,000 mg by mouth 2 (two) times daily as needed for mild pain or moderate pain (back pain or cold symptoms).     Historical Provider, MD  calcium carbonate (TUMS - DOSED IN MG ELEMENTAL CALCIUM) 500 MG chewable tablet Chew 2 tablets by mouth as needed for indigestion or heartburn.    Historical Provider, MD  escitalopram (LEXAPRO) 10 MG tablet Take 1 tablet (10 mg total) by mouth daily. 10/04/14   Gerrit Heck, CNM  ferrous sulfate 325 (65 FE) MG tablet Take 325 mg by mouth 2 (two) times daily with a meal.    Historical Provider, MD  ibuprofen (ADVIL,MOTRIN) 600 MG tablet Take 1 tablet (600 mg total) by mouth every 6 (six) hours. 10/04/14  Gerrit Heck, CNM  oxyCODONE-acetaminophen (PERCOCET/ROXICET) 5-325 MG per tablet Take 1-2 tablets by mouth every 6 (six) hours as needed (for pain scale equal to or greater than 7). 10/04/14   Gerrit Heck, CNM  PARoxetine (PAXIL) 20 MG tablet Take 1 tablet by mouth See admin instructions. Take 1/2 tab daily for 7 days then increase to 1 tab daily 12/10/14   Historical Provider, MD  Prenatal Vit w/Fe-Methylfol-FA (PNV PO) Take 1 tablet by mouth daily.     Historical Provider, MD  senna-docusate (SENOKOT-S) 8.6-50 MG per tablet Take 2 tablets by mouth at bedtime. 10/04/14   Gerrit Heck, CNM  simethicone (MYLICON) 80 MG chewable tablet Chew 1 tablet (80 mg total) by mouth daily. 10/04/14   Gerrit Heck, CNM   BP 95/61 mmHg  Pulse 87  Temp(Src) 98.6 F (37 C) (Oral)  Resp 16  SpO2 100% Physical Exam  Constitutional: She appears  well-developed and well-nourished.  Appears sedated, decreased responsiveness.   HENT:  Head: Atraumatic.  Mouth/Throat: Oropharynx is clear and moist.  Eyes: Conjunctivae are normal. Pupils are equal, round, and reactive to light. No scleral icterus.  Neck: Normal range of motion. Neck supple. No tracheal deviation present.  Cardiovascular: Normal rate, regular rhythm, normal heart sounds and intact distal pulses.   Pulmonary/Chest: Effort normal and breath sounds normal. No respiratory distress.  Abdominal: Soft. Normal appearance and bowel sounds are normal. She exhibits no distension. There is no tenderness.  Genitourinary:  No cva tenderness  Musculoskeletal: She exhibits no edema or tenderness.  Neurological:  Very drowsy, lethargic, is arousable. Makes purposeful movements w bil ext, does not follow commands.   Skin: Skin is warm and dry. No rash noted. She is not diaphoretic.  Psychiatric:  Lethargic, intoxicated, sedated.   Nursing note and vitals reviewed.   ED Course  Procedures (including critical care time) Labs Review   Results for orders placed or performed during the hospital encounter of 01/01/15  CBC  Result Value Ref Range   WBC 7.2 4.0 - 10.5 K/uL   RBC 4.00 3.87 - 5.11 MIL/uL   Hemoglobin 13.6 12.0 - 15.0 g/dL   HCT 16.1 09.6 - 04.5 %   MCV 102.3 (H) 78.0 - 100.0 fL   MCH 34.0 26.0 - 34.0 pg   MCHC 33.3 30.0 - 36.0 g/dL   RDW 40.9 81.1 - 91.4 %   Platelets 257 150 - 400 K/uL  Comprehensive metabolic panel  Result Value Ref Range   Sodium 141 135 - 145 mmol/L   Potassium 3.5 3.5 - 5.1 mmol/L   Chloride 112 96 - 112 mmol/L   CO2 21 19 - 32 mmol/L   Glucose, Bld 117 (H) 70 - 99 mg/dL   BUN 17 6 - 23 mg/dL   Creatinine, Ser 7.82 0.50 - 1.10 mg/dL   Calcium 8.6 8.4 - 95.6 mg/dL   Total Protein 7.1 6.0 - 8.3 g/dL   Albumin 4.0 3.5 - 5.2 g/dL   AST 27 0 - 37 U/L   ALT 36 (H) 0 - 35 U/L   Alkaline Phosphatase 121 (H) 39 - 117 U/L   Total Bilirubin 0.2  (L) 0.3 - 1.2 mg/dL   GFR calc non Af Amer >90 >90 mL/min   GFR calc Af Amer >90 >90 mL/min   Anion gap 8 5 - 15  Ethanol (ETOH)  Result Value Ref Range   Alcohol, Ethyl (B) 319 (H) 0 - 9 mg/dL  Acetaminophen level  Result Value Ref  Range   Acetaminophen (Tylenol), Serum <10.0 (L) 10 - 30 ug/mL  Salicylate level  Result Value Ref Range   Salicylate Lvl <4.0 2.8 - 20.0 mg/dL  Urine rapid drug screen (hosp performed)  Result Value Ref Range   Opiates NONE DETECTED NONE DETECTED   Cocaine NONE DETECTED NONE DETECTED   Benzodiazepines POSITIVE (A) NONE DETECTED   Amphetamines NONE DETECTED NONE DETECTED   Tetrahydrocannabinol NONE DETECTED NONE DETECTED   Barbiturates NONE DETECTED NONE DETECTED      EKG Interpretation   Date/Time:  Wednesday January 01 2015 01:23:14 EDT Ventricular Rate:  96 PR Interval:  135 QRS Duration: 85 QT Interval:  365 QTC Calculation: 461 R Axis:   69 Text Interpretation:  Sinus rhythm No previous tracing Confirmed by Inna Tisdell   MD, Christia Coaxum ([id number]) on 01/01/2015 2:50:08 AM      MDM   Iv ns. Monitor, continuous pulse ox. o2 [place].   Labs.  Reviewed nursing notes and prior charts for additional history.   pts mental status markedly depressed, likely related to severe etoh intoxication, bzd overdose, and haldol via ems.  No resp depression. Pt clearing throat, protecting airway.  Recheck, pt slightly more alert, arousable. No resp depression. Still protecting airway, clears throat, occ non prod cough. Chest cta.   CRITICAL CARE  RE severely altered mental status/overdose, polysubstance abuse, severe alcohol intoxication.  Performed by: Suzi RootsSTEINL,Ching Rabideau E Total critical care time: 40 Critical care time was exclusive of separately billable procedures and treating other patients. Critical care was necessary to treat or prevent imminent or life-threatening deterioration. Critical care was time spent personally by me on the following activities: development  of treatment plan with patient and/or surrogate as well as nursing, discussions with consultants, evaluation of patient's response to treatment, examination of patient, obtaining history from patient or surrogate, ordering and performing treatments and interventions, ordering and review of laboratory studies, ordering and review of radiographic studies, pulse oximetry and re-evaluation of patient's condition.  Psych evaluation pending.  Disposition per psych team.  Recheck on multiple occasions during ed visit.  Bps low. Pt afeb.  Pt much more awake and alert on rechecks. Is conversant. No resp depression. Denies fever/chills. Chest cta. abd soft nt. Denies pain.  Iv ns boluses.     Cathren LaineKevin Lexii Walsh, MD 01/01/15 0400    Cathren LaineKevin Christopherjohn Schiele, MD 01/05/15 [phone]

## 2015-01-01 NOTE — ED Notes (Signed)
Pt has been taking Xanax and a fifth of vodka tonight. Found by EMS alert and oriented but became more unresponsive. Pt became combative and got 5mg  Haldol IM. Pt now has dual nasal airways, O2 dropped to 70% after medications. Alert to touch.

## 2015-01-01 NOTE — ED Notes (Signed)
Patient is now awake.  Ambulated to restroom with one assist, extremely unsteady.  She has removed both nasal trumpets, maintaining her airway at this time.  States she does not remember what happened.

## 2015-01-01 NOTE — ED Notes (Addendum)
Medication quantities  Alprazolam filled 12/26/14 Original: 45 pills Current qty: 28  Duloxetine filled 12/24/14 Original: 30 pills Current qty: 26  Paroxetine filled 12/10/14 Original qty: 30 current qty: 25.5  Pt appears to be undertaking duloxetine and paroxetine and appears to have taken too many xanax.

## 2015-01-01 NOTE — Consult Note (Addendum)
[facility] Consult   Reason for Consult:  Overdose Referring Physician:  EDP Patient Identification: Christine Beck MRN:  [id number] Principal Diagnosis: Postpartum depression Diagnosis:   Patient Active Problem List   Diagnosis Date Noted  . Alcohol abuse [F10.10] 01/01/2015    Priority: High  . Postpartum depression [F53] 01/01/2015    Priority: High  . Overdose [T50.901A] 01/01/2015    Priority: High  . S/P cesarean section [Z98.89] 10/01/2014  . Positive GBS test [B95.1] 09/25/2014  . Generalized anxiety disorder [F41.1] 08/31/2014  . Chronic depression [F32.9] 08/31/2014  . Previous cesarean delivery, antepartum condition or complication [O87.86] 76/72/0947  . Imprisonment--06/2013-11/2013 [Z65.1] 08/31/2014  . Social problem--mother killed in fire 2014, father with terminal lung CA, patient in custody battle  [Z60.9] 08/31/2014  . ADD (attention deficit disorder) [F90.9] 08/31/2014  . IUGR (intrauterine growth restriction) affecting care of mother, hx abnormal dopplers--normal growth and dopplers at 37 weeks. [O36.5990]     Total Time spent with patient: 45 minutes  Subjective:   Christine Beck is a 29 y.o. female patient admitted with overdose.  HPI:  The patient presented to the ED after the police were called to her home.  When they arrived, she chugged a bottle of vodka and took her some of her prescription medications (Prenatal vitamins, Ativan, Iron supplement, and Cymbalta).  She has been living with her fiancee and her two children (72 and 6 week old), his two children, his sister and her husband, and his two parents.  She was stressed with keeping the house clean, working as a Educational psychologist at [employer], and caring for her children. Christine Beck reports being upset when the house was a mess and she started slamming things down.  Her fiancee's dad who is a policeman asked her to stop and when she did not, he called the police.  Christine Beck had a baby ten weeks ago and  her fiancee works many hours.  Both of her parents died last year--one in a fire and one by lung cancer.  History of depression with psychotherapeutic services at [facility] clinic.  BAL of 319 in ED with positive benzodiazepines. HPI Elements:   Location:  generalized. Quality:  acute. Severity:  severe. Timing:  constant. Duration:  couple of days. Context:  stressors.  Past Medical History:  Past Medical History  Diagnosis Date  . Infection     frequent UTIs  . Anxiety   . Depression   . Termination of pregnancy (fetus) x 2    2005, 2006 - both at 6 wks  . Anemia   . Heartburn in pregnancy   . Headache     otc med prn  . History of ADHD     dx as a child  . Former smoker, stopped smoking in distant past   . H/O drug abuse   . History of bipolar disorder     Past Surgical History  Procedure Laterality Date  . Cesarean section  2013    [place]  . Cesarean section N/A 10/01/2014    Procedure: REPEAT CESAREAN SECTION;  Surgeon: Crawford Givens, MD;  Location: [facility];  Service: Obstetrics;  Laterality: N/A;   Family History:  Family History  Problem Relation Age of Onset  . Cancer Father    Social History:  History  Alcohol Use No     History  Drug Use No    History   Social History  . Marital Status: Single    Spouse Name: N/A  . Number of  Children: N/A  . Years of Education: N/A   Social History Main Topics  . Smoking status: Never Smoker   . Smokeless tobacco: Never Used  . Alcohol Use: No  . Drug Use: No  . Sexual Activity: Yes    Birth Control/ Protection: None   Other Topics Concern  . None   Social History Narrative   Additional Social History:                          Allergies:   Allergies  Allergen Reactions  . Abilify [Aripiprazole] Other (See Comments)    Walking seizure    Labs:  Results for orders placed or performed during the hospital encounter of 01/01/15 (from the past 48 hour(s))  Urine rapid drug screen (hosp  performed)     Status: Abnormal   Collection Time: 01/01/15  1:28 AM  Result Value Ref Range   Opiates NONE DETECTED NONE DETECTED   Cocaine NONE DETECTED NONE DETECTED   Benzodiazepines POSITIVE (A) NONE DETECTED   Amphetamines NONE DETECTED NONE DETECTED   Tetrahydrocannabinol NONE DETECTED NONE DETECTED   Barbiturates NONE DETECTED NONE DETECTED    Comment:        DRUG SCREEN FOR MEDICAL PURPOSES ONLY.  IF CONFIRMATION IS NEEDED FOR ANY PURPOSE, NOTIFY LAB WITHIN 5 DAYS.        LOWEST DETECTABLE LIMITS FOR URINE DRUG SCREEN Drug Class       Cutoff (ng/mL) Amphetamine      1000 Barbiturate      200 Benzodiazepine   630 Tricyclics       160 Opiates          300 Cocaine          300 THC              50   CBC     Status: Abnormal   Collection Time: 01/01/15  1:35 AM  Result Value Ref Range   WBC 7.2 4.0 - 10.5 K/uL   RBC 4.00 3.87 - 5.11 MIL/uL   Hemoglobin 13.6 12.0 - 15.0 g/dL   HCT 40.9 36.0 - 46.0 %   MCV 102.3 (H) 78.0 - 100.0 fL   MCH 34.0 26.0 - 34.0 pg   MCHC 33.3 30.0 - 36.0 g/dL   RDW 14.0 11.5 - 15.5 %   Platelets 257 150 - 400 K/uL  Comprehensive metabolic panel     Status: Abnormal   Collection Time: 01/01/15  1:35 AM  Result Value Ref Range   Sodium 141 135 - 145 mmol/L   Potassium 3.5 3.5 - 5.1 mmol/L   Chloride 112 96 - 112 mmol/L   CO2 21 19 - 32 mmol/L   Glucose, Bld 117 (H) 70 - 99 mg/dL   BUN 17 6 - 23 mg/dL   Creatinine, Ser 0.71 0.50 - 1.10 mg/dL   Calcium 8.6 8.4 - 10.5 mg/dL   Total Protein 7.1 6.0 - 8.3 g/dL   Albumin 4.0 3.5 - 5.2 g/dL   AST 27 0 - 37 U/L   ALT 36 (H) 0 - 35 U/L   Alkaline Phosphatase 121 (H) 39 - 117 U/L   Total Bilirubin 0.2 (L) 0.3 - 1.2 mg/dL   GFR calc non Af Amer >90 >90 mL/min   GFR calc Af Amer >90 >90 mL/min    Comment: (NOTE) The eGFR has been calculated using the CKD EPI equation. This calculation has not been validated in all  clinical situations. eGFR's persistently <90 mL/min signify possible Chronic  Kidney Disease.    Anion gap 8 5 - 15  Ethanol (ETOH)     Status: Abnormal   Collection Time: 01/01/15  1:35 AM  Result Value Ref Range   Alcohol, Ethyl (B) 319 (H) 0 - 9 mg/dL    Comment:        LOWEST DETECTABLE LIMIT FOR SERUM ALCOHOL IS 11 mg/dL FOR MEDICAL PURPOSES ONLY   Acetaminophen level     Status: Abnormal   Collection Time: 01/01/15  1:35 AM  Result Value Ref Range   Acetaminophen (Tylenol), Serum <10.0 (L) 10 - 30 ug/mL    Comment:        THERAPEUTIC CONCENTRATIONS VARY SIGNIFICANTLY. A RANGE OF 10-30 ug/mL MAY BE AN EFFECTIVE CONCENTRATION FOR MANY PATIENTS. HOWEVER, SOME ARE BEST TREATED AT CONCENTRATIONS OUTSIDE THIS RANGE. ACETAMINOPHEN CONCENTRATIONS >150 ug/mL AT 4 HOURS AFTER INGESTION AND >50 ug/mL AT 12 HOURS AFTER INGESTION ARE OFTEN ASSOCIATED WITH TOXIC REACTIONS.   Salicylate level     Status: None   Collection Time: 01/01/15  1:35 AM  Result Value Ref Range   Salicylate Lvl <8.7 2.8 - 20.0 mg/dL    Vitals: Blood pressure 101/52, pulse 106, temperature 98 F (36.7 C), temperature source Oral, resp. rate 18, SpO2 100 %, unknown if currently breastfeeding.  Risk to Self: Is patient at risk for suicide?: Yes Risk to Others:   Prior Inpatient Therapy:   Prior Outpatient Therapy:    Current Facility-Administered Medications  Medication Dose Route Frequency Provider Last Rate Last Dose  . DULoxetine (CYMBALTA) DR capsule 30 mg  30 mg Oral Daily Leliana Kontz   30 mg at 01/01/15 1310  . gabapentin (NEURONTIN) capsule 300 mg  300 mg Oral QHS Christine Beck      . LORazepam (ATIVAN) tablet 1 mg  1 mg Oral Q4H PRN Clancy Leiner      . traZODone (DESYREL) tablet 50 mg  50 mg Oral QHS PRN Nyle Limb       Current Outpatient Prescriptions  Medication Sig Dispense Refill  . ALPRAZolam (XANAX) 1 MG tablet Take 1 tablet (1 mg total) by mouth 2 (two) times daily as needed for anxiety. 30 tablet 0  . DULoxetine (CYMBALTA) 30 MG capsule Take 30  mg by mouth daily.    Marland Kitchen acetaminophen (TYLENOL) 500 MG tablet Take 1,000 mg by mouth 2 (two) times daily as needed for mild pain or moderate pain (back pain or cold symptoms).     . calcium carbonate (TUMS - DOSED IN MG ELEMENTAL CALCIUM) 500 MG chewable tablet Chew 2 tablets by mouth as needed for indigestion or heartburn.    . escitalopram (LEXAPRO) 10 MG tablet Take 1 tablet (10 mg total) by mouth daily. 30 tablet 3  . ferrous sulfate 325 (65 FE) MG tablet Take 325 mg by mouth 2 (two) times daily with a meal.    . ibuprofen (ADVIL,MOTRIN) 600 MG tablet Take 1 tablet (600 mg total) by mouth every 6 (six) hours. 30 tablet 2  . oxyCODONE-acetaminophen (PERCOCET/ROXICET) 5-325 MG per tablet Take 1-2 tablets by mouth every 6 (six) hours as needed (for pain scale equal to or greater than 7). 30 tablet 0  . PARoxetine (PAXIL) 20 MG tablet Take 1 tablet by mouth See admin instructions. Take 1/2 tab daily for 7 days then increase to 1 tab daily  1  . Prenatal Vit w/Fe-Methylfol-FA (PNV PO) Take 1 tablet by mouth daily.     Marland Kitchen  senna-docusate (SENOKOT-S) 8.6-50 MG per tablet Take 2 tablets by mouth at bedtime. 14 tablet 0  . simethicone (MYLICON) 80 MG chewable tablet Chew 1 tablet (80 mg total) by mouth daily. 14 tablet 0    Musculoskeletal: Strength & Muscle Tone: within normal limits Gait & Station: normal Patient leans: N/A  Psychiatric Specialty Exam:     Blood pressure 101/52, pulse 106, temperature 98 F (36.7 C), temperature source Oral, resp. rate 18, SpO2 100 %, unknown if currently breastfeeding.There is no weight on file to calculate BMI.  General Appearance: Disheveled  Eye Contact::  Good  Speech:  Normal Rate  Volume:  Normal  Mood:  Anxious and Depressed  Affect:  Congruent  Thought Process:  Coherent  Orientation:  Full (Time, Place, and Person)  Thought Content:  Rumination  Suicidal Thoughts:  Yes.  with intent/plan  Homicidal Thoughts:  No  Memory:  Immediate;    Fair Recent;   Fair Remote;   Fair  Judgement:  Poor  Insight:  Lacking  Psychomotor Activity:  Decreased  Concentration:  Fair  Recall:  [employer] of Knowledge:Good  Language: Good  Akathisia:  No  Handed:  Right  AIMS (if indicated):     Assets:  Housing Intimacy Leisure Time Physical Health Resilience Social Support  ADL's:  Intact  Cognition: WNL  Sleep:      Medical Decision Making: Review of Psycho-Social Stressors (1), Review or order clinical lab tests (1) and Review of Medication Regimen & Side Effects (2)  Treatment Plan Summary: Daily contact with patient to assess and evaluate symptoms and progress in treatment, Medication management and Plan [facility] to [facility]  Plan:  Recommend psychiatric Inpatient admission when medically cleared. Disposition: Johny Sax, PMH-NP 01/01/2015 3:52 PM Patient seen face-to-face for psychiatric evaluation, chart reviewed and case discussed with the physician extender and developed treatment plan. Reviewed the information documented and agree with the treatment plan. Corena Pilgrim, MD

## 2015-01-01 NOTE — ED Notes (Signed)
Pt alert and was made aware of why she was here and for how long.

## 2015-01-01 NOTE — ED Notes (Signed)
Report called to RN Apolinar JunesBrandon, [facility], pending [facility] transport in 1 hr.

## 2015-01-01 NOTE — ED Notes (Signed)
The patient spoke with the attending RN Jan stating that "I am missing my heart diamond necklace and a $2,000 engagement ring." The attending RN informed the Clinical research associatewriter, therefore the Clinical research associatewriter spoke to pervious [facility] from [employer]. The CNA stated that the patient did not have a ring or necklace in her belongings. The CNA also, looked in the patients chart and did not see any documentation of the patients necklace or ring. The writer informed the attending RN Jan what the CNA stated. The patient refused to sign her belonging sheet.

## 2015-01-01 NOTE — ED Notes (Signed)
Patient called her family, she stated that they have her necklace and engagement ring.

## 2015-01-01 NOTE — ED Notes (Signed)
Spoke with Patty[facility], and updated her on patient's condition.  She advises to call them if there are any changes, otherwise just continue supportive care.

## 2015-01-01 NOTE — ED Notes (Signed)
MD Knapp at bedside 

## 2015-01-01 NOTE — ED Notes (Signed)
Pt AAO x 3, no distress noted, watching TV at present.  [facility] and report, pt is [facility].  Monitoring for safety, Q 15 min checks in effect.

## 2015-01-01 NOTE — ED Notes (Signed)
Psychiatry at bedside.

## 2015-01-01 NOTE — ED Notes (Signed)
Bed: WA27 Expected date:  Expected time:  Means of arrival:  Comments: 

## 2015-01-01 NOTE — BH Assessment (Signed)
[facility] Assessment Progress Note  Per Thedore MinsMojeed Akintayo, MD, this pt requires psychiatric hospitalization at this time.  Berneice Heinrichina Tate, RN, [facility] Rm 302-2.  Pt has signed Consent to Release Information, and this form along with IVC paperwork has [facility].  Pt's nurse, Jan, has been notified.  She agrees to send original paperwork along with pt via [employer], and to call report to [phone].  Doylene Canninghomas Eian Vandervelden, MA Triage Specialist [phone]

## 2015-01-01 NOTE — ED Notes (Signed)
Bed: [id number] Expected date:  Expected time:  Means of arrival:  Comments: EMS  Overdose, restraints

## 2015-01-01 NOTE — ED Notes (Signed)
[facility] at bedside to transport pt to [facility]. 

## 2015-01-02 ENCOUNTER — Encounter (HOSPITAL_COMMUNITY): Payer: Self-pay | Admitting: *Deleted

## 2015-01-02 DIAGNOSIS — F4310 Post-traumatic stress disorder, unspecified: Secondary | ICD-10-CM | POA: Diagnosis present

## 2015-01-02 DIAGNOSIS — T424X2A Poisoning by benzodiazepines, intentional self-harm, initial encounter: Secondary | ICD-10-CM

## 2015-01-02 DIAGNOSIS — F331 Major depressive disorder, recurrent, moderate: Principal | ICD-10-CM

## 2015-01-02 DIAGNOSIS — F321 Major depressive disorder, single episode, moderate: Secondary | ICD-10-CM | POA: Diagnosis present

## 2015-01-02 DIAGNOSIS — T1491 Suicide attempt: Secondary | ICD-10-CM

## 2015-01-02 MED ORDER — TRAZODONE HCL 50 MG PO TABS
50.0000 mg | ORAL_TABLET | Freq: Every evening | ORAL | Status: DC | PRN
  Administered 2015-01-02 (×2): 50 mg via ORAL
  Filled 2015-01-02: qty 1
  Filled 2015-01-02: qty 3
  Filled 2015-01-02: qty 1
  Filled 2015-01-02: qty 3

## 2015-01-02 MED ORDER — GABAPENTIN 300 MG PO CAPS
300.0000 mg | ORAL_CAPSULE | Freq: Every day | ORAL | Status: DC
  Administered 2015-01-02: 300 mg via ORAL
  Filled 2015-01-02 (×2): qty 1

## 2015-01-02 MED ORDER — ACETAMINOPHEN 325 MG PO TABS
650.0000 mg | ORAL_TABLET | Freq: Four times a day (QID) | ORAL | Status: DC | PRN
  Administered 2015-01-02 (×2): 650 mg via ORAL
  Filled 2015-01-02 (×2): qty 2

## 2015-01-02 MED ORDER — DULOXETINE HCL 30 MG PO CPEP
30.0000 mg | ORAL_CAPSULE | Freq: Every day | ORAL | Status: DC
  Administered 2015-01-02 – 2015-01-03 (×2): 30 mg via ORAL
  Filled 2015-01-02 (×4): qty 1

## 2015-01-02 MED ORDER — MAGNESIUM HYDROXIDE 400 MG/5ML PO SUSP: 30.0000 mL | Freq: Every day | ORAL | Status: DC | PRN

## 2015-01-02 MED ORDER — ALUM & MAG HYDROXIDE-SIMETH 200-200-20 MG/5ML PO SUSP: 30.0000 mL | ORAL | Status: DC | PRN

## 2015-01-02 MED ORDER — LORAZEPAM 1 MG PO TABS: 1.0000 mg | ORAL_TABLET | ORAL | Status: DC | PRN
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
## 2015-01-02 NOTE — [facility] Group Notes (Signed)
[facility] LCSW Group Therapy 01/02/2015 1:15 PM Type of Therapy: Group Therapy Participation Level: Active  Participation Quality: Attentive, Sharing and Supportive  Affect: Appropriate  Cognitive: Alert and Oriented  Insight: Developing/Improving and Engaged  Engagement in Therapy: Developing/Improving and Engaged  Modes of Intervention: Activity, Clarification, Confrontation, Discussion, Education, Exploration, Limit-setting, Orientation, Problem-solving, Rapport Building, Reality Testing, Socialization and Support  Summary of Progress/Problems: Patient was attentive and engaged with speaker from [employer]. Patient was attentive to speaker while they shared their story of dealing with mental health and overcoming it. Patient expressed interest in their programs and services and received information on their agency. Patient processed ways they can relate to the speaker.   Tecia Cinnamon, MSW, LCSWA Clinical Social Worker [facility] [phone]   
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
## 2015-01-02 NOTE — Progress Notes (Signed)
D: Patient denies SI/HI and A/V hallucinations  A: Monitored q 15 minutes; patient encouraged to attend groups; patient educated about medications; patient given medications per physician orders; patient encouraged to express feelings and/or concerns  R: Patient is cooperative, patient is flat and blunted; patient is minimal in her interaction with staff and but engages with peers; patient forwards little information; patient attended some of the afternoon groups

## 2015-01-02 NOTE — Tx Team (Signed)
Initial Interdisciplinary Treatment Plan   PATIENT STRESSORS: Financial difficulties Loss of parents a year ago Substance abuse   PATIENT STRENGTHS: Average or above average intelligence Communication skills General fund of knowledge   PROBLEM LIST: Problem List/Patient Goals Date to be addressed Date deferred Reason deferred Estimated date of resolution  "start liking myself again"  01/01/15     Coping skills 01/01/15     depression 01/01/15                                          DISCHARGE CRITERIA:  Improved stabilization in mood, thinking, and/or behavior Need for constant or close observation no longer present  PRELIMINARY DISCHARGE PLAN: Attend PHP/IOP  PATIENT/FAMIILY INVOLVEMENT: This treatment plan has been presented to and reviewed with the patient, Christine MorrowRebecca Beck.  The patient and family have been given the opportunity to ask questions and make suggestions.  Arrie AranChurch, Christine Beck 01/02/2015, 12:45 AM
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
## 2015-01-02 NOTE — B[facility] Group Notes (Signed)
The focus of this group is to educate the patient on the purpose and policies of crisis stabilization and provide a format to answer questions about their admission.  The group details unit policies and expectations of patients while admitted. Patient did not attend this group. 

## 2015-01-03 ENCOUNTER — Encounter (HOSPITAL_COMMUNITY): Payer: Self-pay | Admitting: Registered Nurse

## 2015-01-03 DIAGNOSIS — F331 Major depressive disorder, recurrent, moderate: Secondary | ICD-10-CM | POA: Insufficient documentation

## 2015-01-03 MED ORDER — GABAPENTIN 300 MG PO CAPS: 300.0000 mg | ORAL_CAPSULE | Freq: Every day | ORAL | Status: AC

## 2015-01-03 MED ORDER — TRAZODONE HCL 50 MG PO TABS: 50.0000 mg | ORAL_TABLET | Freq: Every evening | ORAL | Status: AC | PRN

## 2015-01-03 MED ORDER — DULOXETINE HCL 30 MG PO CPEP: 30.0000 mg | ORAL_CAPSULE | Freq: Every day | ORAL | Status: AC

## 2015-01-03 NOTE — Progress Notes (Signed)
  [facility] :  Will you be returning to the same living situation after discharge:  Yes,  home with fiance and family At discharge, do you have transportation home?: Yes,  fiance/family member coming at 1PM Do you have the ability to pay for your medications: Yes,  [employer] [place]  Release of information consent forms completed and submitted to medical records by CSW.  Patient to Follow up at: Follow-up Information    [facility] On 01/07/2015.   Why:  Appt with Jake for therapy on this date at 12:30PM.     Contact information:   [address] [place], [place] [address] Phone: [phone] Fax: [phone]      [facility]  On 01/21/2015.   Why:  Appt with Dr. Dub MikesLugo on this date at 2:00PM for medication management.    Contact information:   ATTN: Dr. Dub MikesLugo [address] [place], [place] [address] Phone: [phone] Fax: [phone]      Patient denies SI/HI: Yes,  during group/self report.     Safety Planning and Suicide Prevention discussed: Yes,  Contact attempts made with pt's fiance. SPE completed with pt and she was encouraged to share information with support network, ask questions, and talk about any concerns relating to SPE. Mobile Crisis information also provided to pt.   Have you used any form of tobacco in the last 30 days? (Cigarettes, Smokeless Tobacco, Cigars, and/or Pipes): Yes  Has patient been referred to the Quitline?: Patient refused referral  Smart, Lebron QuamHeather LCSWA  01/03/2015, 10:16 AM

## 2015-01-03 NOTE — Plan of Care (Signed)
Problem: Alteration in mood & ability to function due to Goal: LTG-Pt reports reduction in suicidal thoughts (Patient reports reduction in suicidal thoughts and is able to verbalize a safety plan for whenever patient is feeling suicidal)  Outcome: Progressing Pt denies SI and verbally contracts for safety.   Goal: STG-Patient will attend groups Outcome: Progressing Pt attended evening group on 01/02/15.

## 2015-01-03 NOTE — Progress Notes (Signed)
D: Pt has appropriate affect and mood.  Pt reports her day has "been okay" and that her goal was "to work on myself."  Pt reports "I'm leaving tomorrow after 1.  I feel okay with that.  I plan on getting home and working as much as possible to keep me out of that situation."  Pt was referring to her living situation.  Pt reports "I'm going to sit down and talk with him and his family.  If anything, we have 30 days if they decide to kick me out."  Pt was referring to her fiancee and his family.  She reports they will have 30 days to find another place if they need to.  Pt reports she feels safe and ready to discharge tomorrow.  Pt denies SI/HI, denies hallucinations, denies withdrawal symptoms.  Pt has been visible in milieu interacting with peers and staff appropriately.  Pt attended evening karaoke group.   A:  Met with pt 1:1 and provided support and encouragement.  Actively listened to pt.  PRN medication administered for pain and sleep.   R: Pt is compliant with medications.  Pt verbally contracts for safety.  Will continue to monitor and assess.

## 2015-01-03 NOTE — Discharge Summary (Signed)
Physician Discharge Summary Note  Patient:  Christine Beck is an 29 y.o., female MRN:  [id number] DOB:  [birth year] Patient phone:  [phone] (home)  Patient address:   [place] [address],  Total Time spent with patient: Greater than 30 minutes  Date of Admission:  01/01/2015 Date of Discharge: 01/03/2015  Reason for Admission:  Per H&P admission:  29 Y/O female who states she was at home, had a couple of glasses of Vodka. States she does not drink a lot. Maybe twice a week, couple of glasses of wine. States that the Vodka made her feel more agitated. States she lives with 10 people that do not take care of things around the house. States she went to pick up her fiancee from work. When she came back there was another pile of dishes. She started cleaning up, admits she was slamming things around, expressing her frustration. Got in an argument with fiancee. States she has had a Xanax early in the morning. She drank some more. Her fiancee's father called the police. She took some more Xanax after she heard that the police were called. She admits she lives in a stressful environment with all these different personalities. She has a 10 W/O baby. She works and her fiancee also works The initial assessment at the ED is as follows: The patient presented to the ED after the police were called to her home. When they arrived, she chugged a bottle of vodka and took her some of her prescription medications (Prenatal vitamins, Ativan, Iron supplement, and Cymbalta). She has been living with her fiancee and her two children (65 and 58 week old), his two children, his sister and her husband, and his two parents. She was stressed with keeping the house clean, working as a Educational psychologist at [employer], and caring for her children. Aryahi reports being upset when the house was a mess and she started slamming things down. Her fiancee's dad who is a policeman asked her to stop and when she did not,  he called the police. Candas had a baby ten weeks ago and her fiancee works many hours. Both of her parents died last year--one in a fire and one by lung cancer. History of depression with psychotherapeutic services at [facility] clinic. BAL of 319 in ED with positive benzodiazepines.   Principal Problem: Moderate major depression Discharge Diagnoses: Patient Active Problem List   Diagnosis Date Noted  . Major depressive disorder, recurrent episode, moderate [F33.1]   . 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 PTSD (post-traumatic stress disorder) [F43.10] 01/02/2015  . Alcohol abuse [F10.10] 01/01/2015  . 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 Overdose [T50.901A] 01/01/2015  . S/P cesarean section [Z98.89] 10/01/2014  . Positive GBS test [B95.1] 09/25/2014  . Generalized anxiety disorder [F41.1] 08/31/2014  . Chronic depression [F32.9] 08/31/2014  . Previous cesarean delivery, antepartum condition or complication [O13.08] 65/78/4696  . Imprisonment--06/2013-11/2013 [Z65.1] 08/31/2014  . Social problem--mother killed in fire 2014, father with terminal lung CA, patient in custody battle  [Z60.9] 08/31/2014  . ADD (attention deficit disorder) [F90.9] 08/31/2014  . IUGR (intrauterine growth restriction) affecting care of mother, hx abnormal dopplers--normal growth and dopplers at 37 weeks. [O36.5990]     Musculoskeletal: Strength & Muscle Tone: within normal limits Gait & Station: normal Patient leans: N/A  Psychiatric Specialty Exam:  See Suicide Risk Assessment Physical Exam  Constitutional: She is oriented to person, place, and time.  Neck: Normal range of motion.  Respiratory: Effort normal.  Musculoskeletal: Normal range of motion.  Neurological: She is alert and oriented to person, place, and time.    Review of Systems  Psychiatric/Behavioral: Negative for suicidal ideas, hallucinations and memory loss. Depression: Stable. Nervous/anxious: Stable. Insomnia: Stable.    All other systems reviewed and are negative.   Blood pressure 106/65, pulse 82, temperature 98.7 F (37.1 C), temperature source Oral, resp. rate 20, height 5' 1"  (1.549 m), weight 72.122 kg (159 lb), not currently breastfeeding.Body mass index is 30.06 kg/(m^2).    Past Medical History:  Past Medical History  Diagnosis Date  . Infection     frequent UTIs  . Anxiety   . Depression   . Termination of pregnancy (fetus) x 2    2005, 2006 - both at 6 wks  . Anemia   . Heartburn in pregnancy   . Headache     otc med prn  . History of ADHD     dx as a child  . Former smoker, stopped smoking in distant past   . H/O drug abuse   . History of bipolar disorder     Past Surgical History  Procedure Laterality Date  . Cesarean section  2013    [place]  . Cesarean section N/A 10/01/2014    Procedure: REPEAT CESAREAN SECTION;  Surgeon: Crawford Givens, MD;  Location: [facility];  Service: Obstetrics;  Laterality: N/A;   Family History:  Family History  Problem Relation Age of Onset  . Cancer Father    Social History:  History  Alcohol Use  . 3.0 oz/week  . 5 Shots of liquor per week     History  Drug Use No    History   Social History  . Marital Status: Single    Spouse Name: N/A  . Number of Children: N/A  . Years of Education: N/A   Social History Main Topics  . Smoking status: Current Some Day Smoker -- 0.15 packs/day    Types: Cigarettes  . Smokeless tobacco: Current User  . Alcohol Use: 3.0 oz/week    5 Shots of liquor per week  . Drug Use: No  . Sexual Activity: Yes    Birth Control/ Protection: Implant     Comment: reports "implanon" left arm   Other Topics Concern  . None   Social History Narrative     Risk to Self: Is patient at risk for suicide?: Yes What has been your use of drugs/alcohol within the last 12 months?: alcohol 2-3 times per week (about a bottle each time). recent use of vodka "which makes me an angry person." no other drug use  identified.  Risk to Others:   Prior Inpatient Therapy:   Prior Outpatient Therapy:    Level of Care:  OP  Hospital Course:  Milany Geck was admitted for Moderate major depression and crisis management.  She was treated discharged with the medications listed below under Medication List.  Medical problems were identified and treated as needed.  Home medications were restarted as appropriate.  Improvement was monitored by observation and Vella Redhead daily report of symptom reduction.  Emotional and mental status was monitored by daily self-inventory reports completed by Vella Redhead and clinical staff.         Kendle Turbin was evaluated by the treatment team for stability and plans for continued recovery upon discharge.  Jadore Mcguffin motivation was an integral factor for scheduling further treatment.  Employment, transportation, bed availability, health status, family support, and any pending legal issues were  also considered during her hospital stay.  She was offered further treatment options upon discharge including but not limited to Residential, Intensive Outpatient, and Outpatient treatment.  Lavinia Mcneely will follow up with the services as listed below under Follow Up Information.     Upon completion of this admission the patient was both mentally and medically stable for discharge denying suicidal/homicidal ideation, auditory/visual/tactile hallucinations, delusional thoughts and paranoia.      Consults:  psychiatry  Significant Diagnostic Studies:  labs: CBC/Diff, CMET, ETOH, UDS  Discharge Vitals:   Blood pressure 106/65, pulse 82, temperature 98.7 F (37.1 C), temperature source Oral, resp. rate 20, height 5' 1"  (1.549 m), weight 72.122 kg (159 lb), not currently breastfeeding. Body mass index is 30.06 kg/(m^2). Lab Results:   Results for orders placed or performed during the hospital encounter of 01/01/15 (from the past 72 hour(s))  Urine rapid drug screen (hosp performed)      Status: Abnormal   Collection Time: 01/01/15  1:28 AM  Result Value Ref Range   Opiates NONE DETECTED NONE DETECTED   Cocaine NONE DETECTED NONE DETECTED   Benzodiazepines POSITIVE (A) NONE DETECTED   Amphetamines NONE DETECTED NONE DETECTED   Tetrahydrocannabinol NONE DETECTED NONE DETECTED   Barbiturates NONE DETECTED NONE DETECTED    Comment:        DRUG SCREEN FOR MEDICAL PURPOSES ONLY.  IF CONFIRMATION IS NEEDED FOR ANY PURPOSE, NOTIFY LAB WITHIN 5 DAYS.        LOWEST DETECTABLE LIMITS FOR URINE DRUG SCREEN Drug Class       Cutoff (ng/mL) Amphetamine      1000 Barbiturate      200 Benzodiazepine   188 Tricyclics       416 Opiates          300 Cocaine          300 THC              50   CBC     Status: Abnormal   Collection Time: 01/01/15  1:35 AM  Result Value Ref Range   WBC 7.2 4.0 - 10.5 K/uL   RBC 4.00 3.87 - 5.11 MIL/uL   Hemoglobin 13.6 12.0 - 15.0 g/dL   HCT 40.9 36.0 - 46.0 %   MCV 102.3 (H) 78.0 - 100.0 fL   MCH 34.0 26.0 - 34.0 pg   MCHC 33.3 30.0 - 36.0 g/dL   RDW 14.0 11.5 - 15.5 %   Platelets 257 150 - 400 K/uL  Comprehensive metabolic panel     Status: Abnormal   Collection Time: 01/01/15  1:35 AM  Result Value Ref Range   Sodium 141 135 - 145 mmol/L   Potassium 3.5 3.5 - 5.1 mmol/L   Chloride 112 96 - 112 mmol/L   CO2 21 19 - 32 mmol/L   Glucose, Bld 117 (H) 70 - 99 mg/dL   BUN 17 6 - 23 mg/dL   Creatinine, Ser 0.71 0.50 - 1.10 mg/dL   Calcium 8.6 8.4 - 10.5 mg/dL   Total Protein 7.1 6.0 - 8.3 g/dL   Albumin 4.0 3.5 - 5.2 g/dL   AST 27 0 - 37 U/L   ALT 36 (H) 0 - 35 U/L   Alkaline Phosphatase 121 (H) 39 - 117 U/L   Total Bilirubin 0.2 (L) 0.3 - 1.2 mg/dL   GFR calc non Af Amer >90 >90 mL/min   GFR calc Af Amer >90 >90 mL/min    Comment: (NOTE) The eGFR  has been calculated using the CKD EPI equation. This calculation has not been validated in all clinical situations. eGFR's persistently <90 mL/min signify possible Chronic  Kidney Disease.    Anion gap 8 5 - 15  Ethanol (ETOH)     Status: Abnormal   Collection Time: 01/01/15  1:35 AM  Result Value Ref Range   Alcohol, Ethyl (B) 319 (H) 0 - 9 mg/dL    Comment:        LOWEST DETECTABLE LIMIT FOR SERUM ALCOHOL IS 11 mg/dL FOR MEDICAL PURPOSES ONLY   Acetaminophen level     Status: Abnormal   Collection Time: 01/01/15  1:35 AM  Result Value Ref Range   Acetaminophen (Tylenol), Serum <10.0 (L) 10 - 30 ug/mL    Comment:        THERAPEUTIC CONCENTRATIONS VARY SIGNIFICANTLY. A RANGE OF 10-30 ug/mL MAY BE AN EFFECTIVE CONCENTRATION FOR MANY PATIENTS. HOWEVER, SOME ARE BEST TREATED AT CONCENTRATIONS OUTSIDE THIS RANGE. ACETAMINOPHEN CONCENTRATIONS >150 ug/mL AT 4 HOURS AFTER INGESTION AND >50 ug/mL AT 12 HOURS AFTER INGESTION ARE OFTEN ASSOCIATED WITH TOXIC REACTIONS.   Salicylate level     Status: None   Collection Time: 01/01/15  1:35 AM  Result Value Ref Range   Salicylate Lvl <0.3 2.8 - 20.0 mg/dL    Physical Findings: AIMS: Facial and Oral Movements Muscles of Facial Expression: None, normal Lips and Perioral Area: None, normal Jaw: None, normal Tongue: None, normal,Extremity Movements Upper (arms, wrists, hands, fingers): None, normal Lower (legs, knees, ankles, toes): None, normal, Trunk Movements Neck, shoulders, hips: None, normal, Overall Severity Severity of abnormal movements (highest score from questions above): None, normal Incapacitation due to abnormal movements: None, normal Patient's awareness of abnormal movements (rate only patient's report): No Awareness, Dental Status Current problems with teeth and/or dentures?: No Does patient usually wear dentures?: No  CIWA:  CIWA-Ar Total: 4 COWS:  COWS Total Score: 1   See Psychiatric Specialty Exam and Suicide Risk Assessment completed by Attending Physician prior to discharge.  Discharge destination:  Home  Is patient on multiple antipsychotic therapies at discharge:  No    Has Patient had three or more failed trials of antipsychotic monotherapy by history:  No    Recommended Plan for Multiple Antipsychotic Therapies: NA      Discharge Instructions    Activity as tolerated - No restrictions    Complete by:  As directed      Diet general    Complete by:  As directed      Discharge instructions    Complete by:  As directed   Take all of you medications as prescribed by your mental healthcare provider.  Report any adverse effects and reactions from your medications to your outpatient provider promptly. Do not engage in alcohol and or illegal drug use while on prescription medicines. In the event of worsening symptoms call the crisis hotline, 911, and or go to the nearest emergency department for appropriate evaluation and treatment of symptoms. Follow-up with your primary care provider for your medical issues, concerns and or health care needs.   Keep all scheduled appointments.  If you are unable to keep an appointment call to reschedule.  Let the nurse know if you will need medications before next scheduled appointment.            Medication List    STOP taking these medications        ALPRAZolam 1 MG tablet  Commonly known as:  Duanne Moron  PARoxetine 20 MG tablet  Commonly known as:  PAXIL      TAKE these medications      Indication   acetaminophen 500 MG tablet  Commonly known as:  TYLENOL  Take 1,000 mg by mouth 2 (two) times daily as needed for mild pain or moderate pain (back pain or cold symptoms).      calcium carbonate 500 MG chewable tablet  Commonly known as:  TUMS - dosed in mg elemental calcium  Chew 2 tablets by mouth as needed for indigestion or heartburn.      DULoxetine 30 MG capsule  Commonly known as:  CYMBALTA  Take 1 capsule (30 mg total) by mouth daily. For depression   Indication:  Major Depressive Disorder     ferrous sulfate 325 (65 FE) MG tablet  Take 325 mg by mouth 2 (two) times daily with a meal.       gabapentin 300 MG capsule  Commonly known as:  NEURONTIN  Take 1 capsule (300 mg total) by mouth at bedtime.   Indication:  Agitation, Alcohol Withdrawal Syndrome     PNV PO  Take 1 tablet by mouth daily.      senna-docusate 8.6-50 MG per tablet  Commonly known as:  Senokot-S  Take 2 tablets by mouth at bedtime.      simethicone 80 MG chewable tablet  Commonly known as:  MYLICON  Chew 1 tablet (80 mg total) by mouth daily.      traZODone 50 MG tablet  Commonly known as:  DESYREL  Take 1 tablet (50 mg total) by mouth at bedtime as needed for sleep.   Indication:  Trouble Sleeping       Follow-up Information    Follow [facility] with [facility] On 01/07/2015.   Why:  Appt with Jake for therapy on this date at 12:30PM.     Contact information:   [address] [place], [place] [address] Phone: [phone] Fax: [phone]      Follow up with [facility]  On 01/21/2015.   Why:  Appt with Dr. Sabra Heck on this date at 2:00PM for medication management.    Contact information:   ATTN: Dr. Sabra Heck [address] [place], [place] [address] Phone: [phone] Fax: [phone]      Follow-up recommendations:  Activity:  As tolerated Diet:  As tolerated  Comments:   Patient has been instructed to take medications as prescribed; and report adverse effects to outpatient provider.  Follow up with primary doctor for any medical issues and If symptoms recur report to nearest emergency or crisis hot line.    Total Discharge Time: Greater than 30 minutes  Signed: Earleen Newport, FNP-BC 01/03/2015, 2:19 PM  I personally assessed the patient and formulated the plan Geralyn Flash A. Sabra Heck, M.D.

## 2015-01-03 NOTE — [facility] Suicide Risk Assessment (Signed)
[facility] INPATIENT:  Family/Significant Other Suicide Prevention Education  Suicide Prevention Education:  Contact Attempts: Karolee StampsChristopher Ring (pt's fiance) [phone] has been identified by the patient as the family member/significant other with whom the patient will be residing, and identified as the person(s) who will aid the patient in the event of a mental health crisis.  With written consent from the patient, two attempts were made to provide suicide prevention education, prior to and/or following the patient's discharge.  We were unsuccessful in providing suicide prevention education.  A suicide education pamphlet was given to the patient to share with family/significant other.  Date and time of first attempt: 01/03/15 8:30AM (VM left requesting call back)  Date and time of second attempt: 01/03/15 10:15AM (VM left requesting call back)   Smart, Zachory Mangual LCSWA  01/03/2015, 10:15 AM
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
## 2015-01-03 NOTE — Progress Notes (Signed)
Patient discharged per physician order; patient denies SI/HI and A/V hallucinations; patient received samples, prescriptions, and copy of AVS after it was reviewed; patient had no other questions or concerns at this time; patient also received a work letter from the Child psychotherapistsocial worker; patient verbalized and signed that she received all belongings; patient left the unit ambulatory

## 2015-01-03 NOTE — [facility] Group Notes (Signed)
[facility]   01/03/2015 11:29 AM  Participation Quality:  Appropriate   Mood/Affect:  Appropriate  Depression Rating:  0  Anxiety Rating:  2-3  Thoughts of Suicide:  No Will you contract for safety?   NA  Current AVH:  No  Plan for Discharge/Comments:  Pt reports that she is doing well today and is ready to d/c home. Appts for med management and therapy have been [facility]. No other concerns noted by pt.   Transportation Means: pt's fiance   Supports: pt's fiance, family, and [place]   Smart, WillowHeather LCSWA

## 2015-01-03 NOTE — Progress Notes (Signed)
Recreation Therapy Notes   Date: 04.29.2016 Time: 9:30am  Location: [address] Group Room   Group Topic: Stress Management  Goal Area(s) Addresses:  Patient will actively participate in stress management techniques presented during session.   Behavioral Response: Did not attend.   Marykay Lexenise L Osie Amparo, LRT/CTRS  Yovanna Cogan L 01/03/2015 1:06 PM

## 2015-10-04 IMAGING — US US OB DETAIL+14 WK
1 series · 12 of 28 positions shown · non-contrast
Comparison: none

[Series 1: us ob detail+14 wk · 0.20mm/px · 12 of 72 slices shown]
[im 3/72]
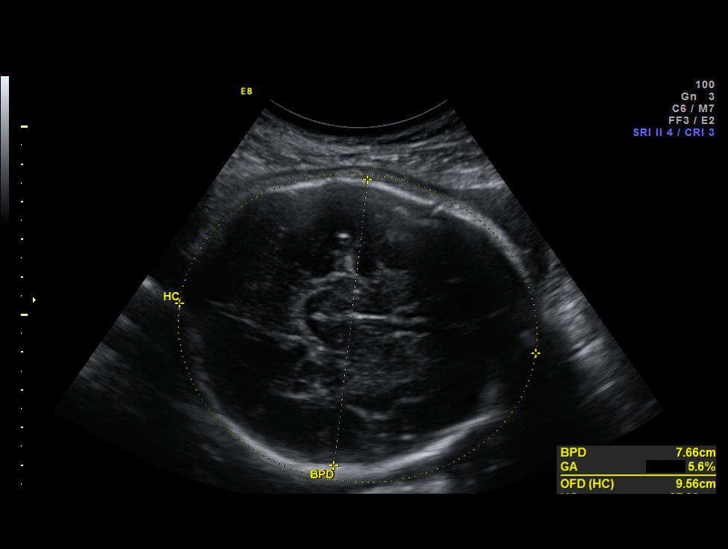
[im 8/72]
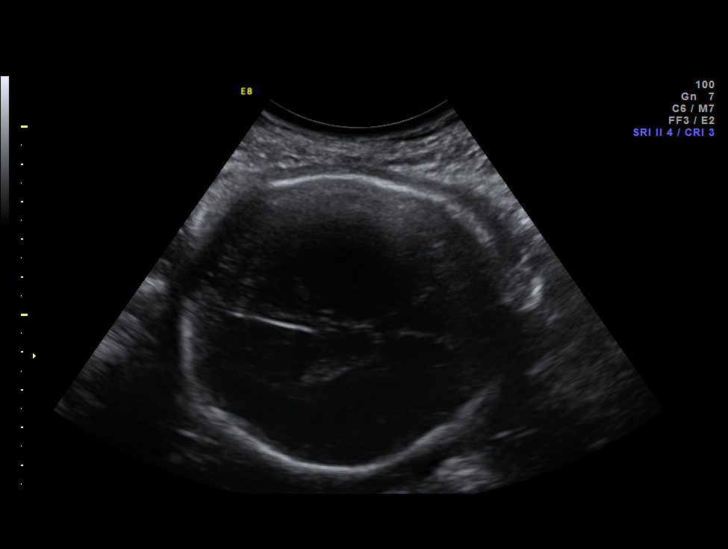
[im 14/72]
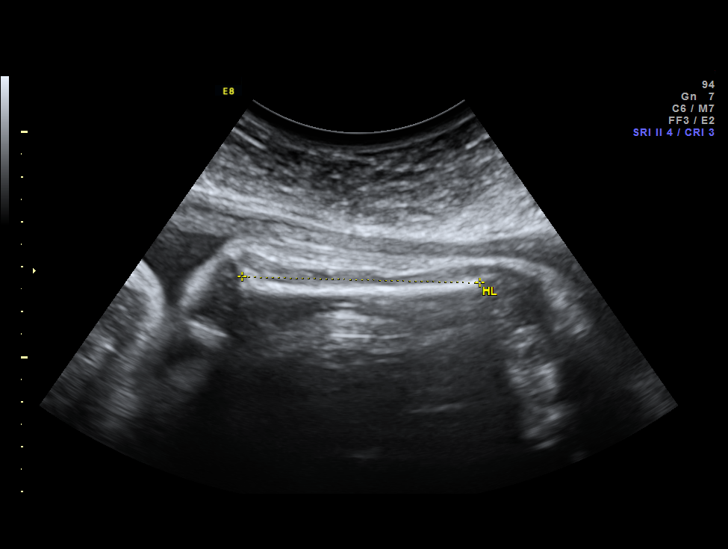
[im 22/72]
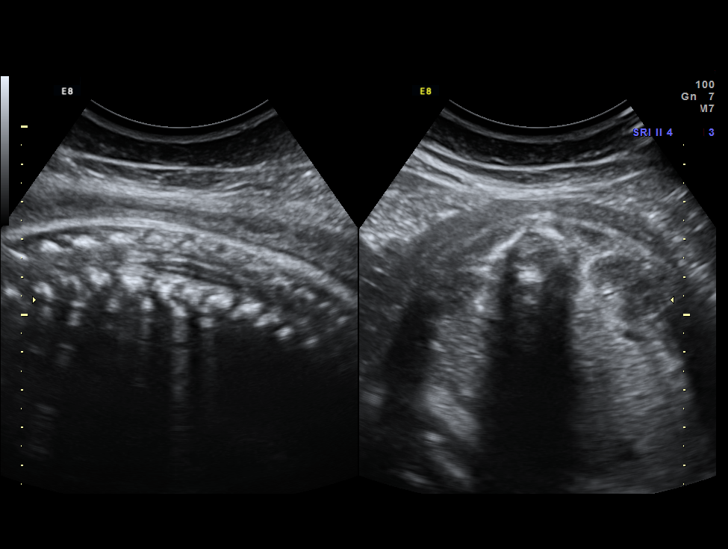
[im 27/72]
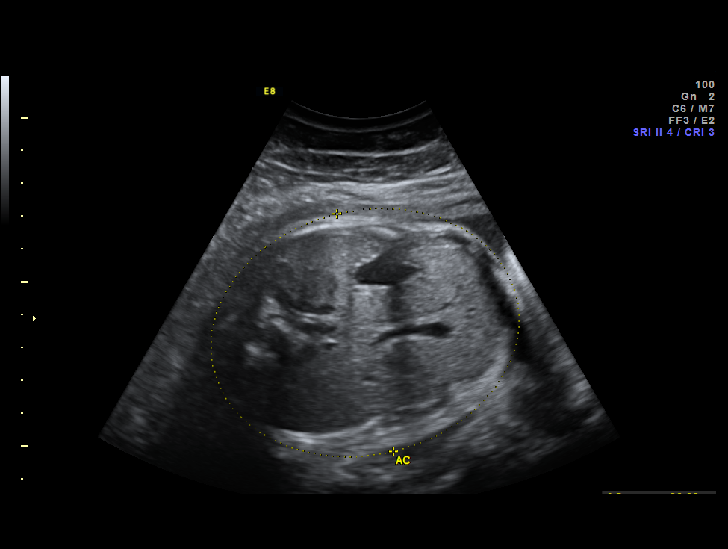
[im 32/72]
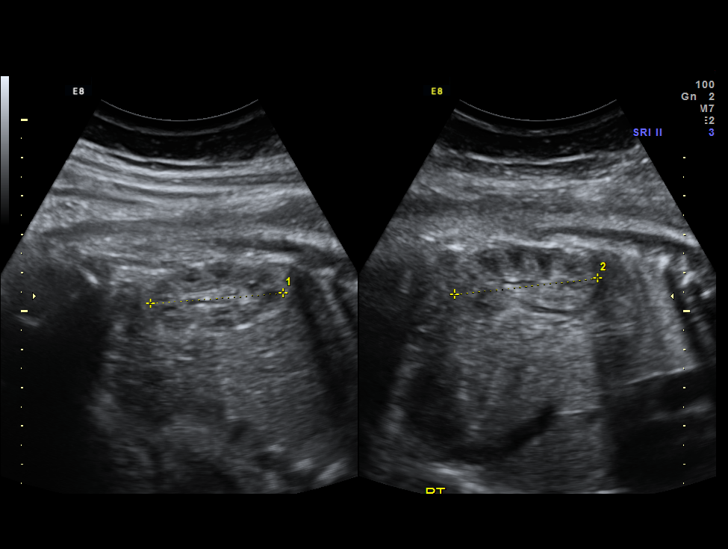
[im 40/72]
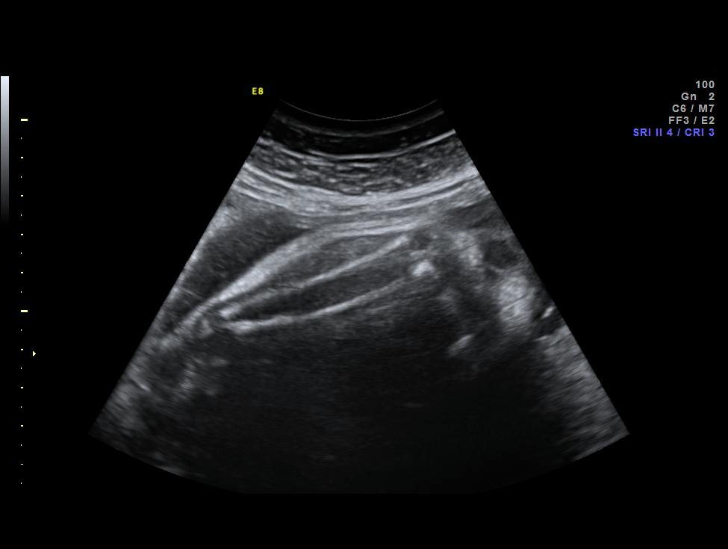
[im 45/72]
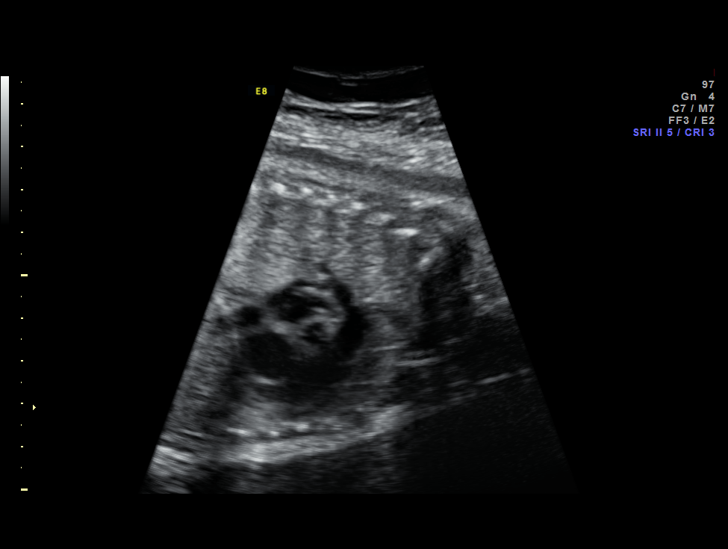
[im 50/72]
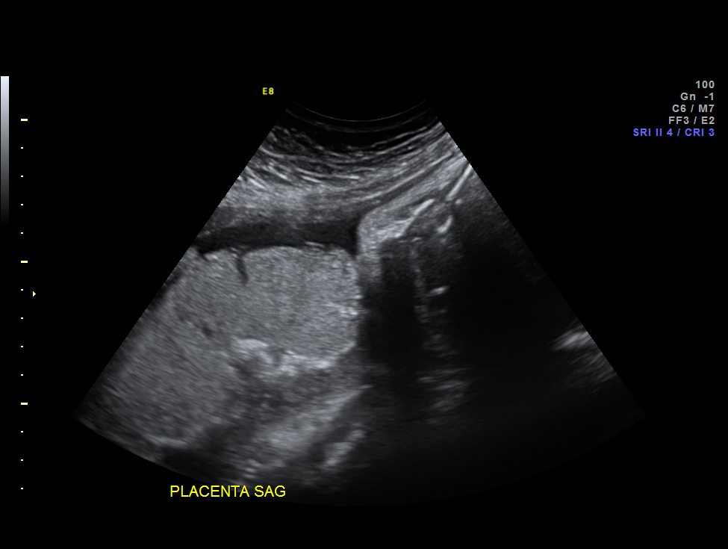
[im 58/72]
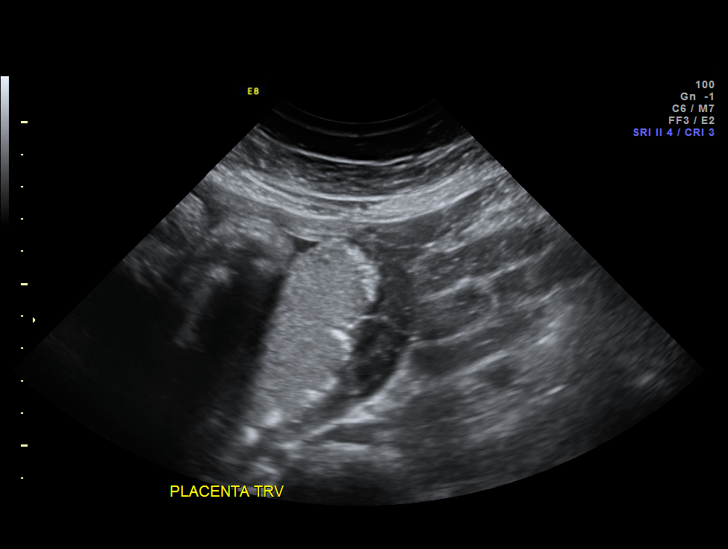
[im 64/72]
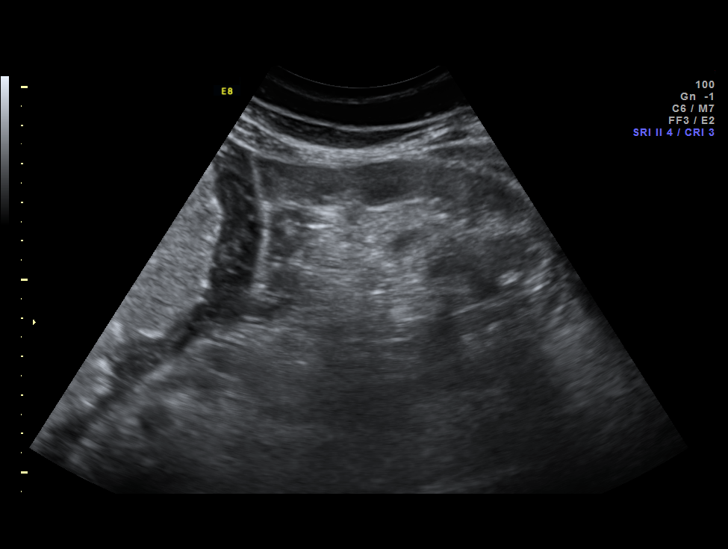
[im 69/72]
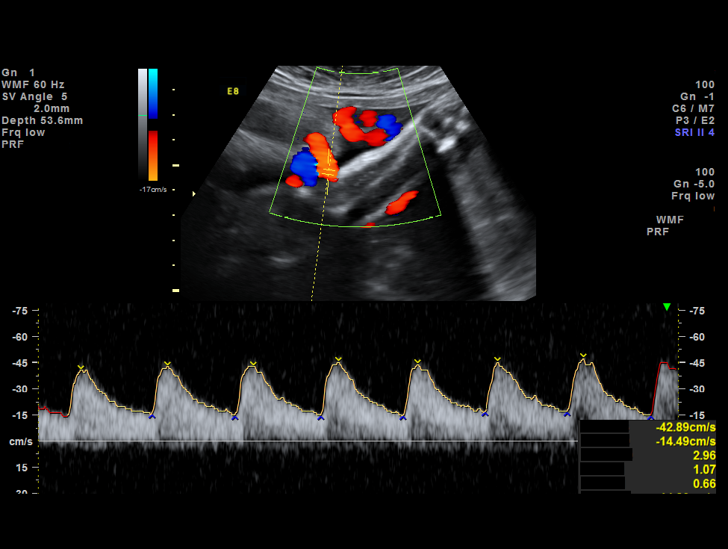

[12 of 28 positions shown; findings below may reference images not displayed]

OBSTETRICS REPORT

Service(s) Provided

 US OB DETAIL + 14 WK                                  76811.0
 US UA CORD DOPPLER                                    76820.0
Indications

 Detailed fetal anatomic survey                        Z36
Fetal Evaluation

 Num Of Fetuses:    1
 Fetal Heart Rate:  129                          bpm
 Cardiac Activity:  Observed
 Presentation:      Cephalic
 Placenta:          Posterior, above cervical
                    os
 P. Cord            Visualized, central
 Insertion:

 Amniotic Fluid
 AFI FV:      Subjectively within normal limits
 AFI Sum:     11.46   cm       28  %Tile     Larg Pckt:    4.26  cm
 RUQ:   4.26    cm   RLQ:    1.53   cm    LUQ:   2.63    cm   LLQ:    3.04   cm
Biophysical Evaluation

 Amniotic F.V:   Pocket => 2 cm two         F. Tone:        Observed
                 planes
 F. Movement:    Observed                   Score:          [DATE]
 F. Breathing:   Observed
Biometry

 BPD:     76.8  mm     G. Age:  30w 6d                CI:         81.9   70 - 86
 OFD:     93.8  mm                                    FL/HC:      22.0   19.1 -

 HC:     276.9  mm     G. Age:  30w 2d      < 3  %    HC/AC:      1.05   0.96 -

 AC:     263.8  mm     G. Age:  30w 3d        7  %    FL/BPD:     79.3   71 - 87
 FL:      60.9  mm     G. Age:  31w 5d       19  %    FL/AC:      23.1   20 - 24
 HUM:     52.3  mm     G. Age:  30w 3d       15  %

 Est. FW:    3005  gm    3 lb 10 oz      27  %
Gestational Age

 Clinical EDD:  32w 3d                                        EDD:   10/15/14
 U/S Today:     30w 6d                                        EDD:   10/26/14
 Best:          32w 3d     Det. By:  Clinical EDD             EDD:   10/15/14
Anatomy

 Cranium:          Appears normal         Aortic Arch:      Appears normal
 Fetal Cavum:      Appears normal         Ductal Arch:      Appears normal
 Ventricles:       Appears normal         Diaphragm:        Appears normal
 Choroid Plexus:   Not well visualized    Stomach:          Appears normal, left
                                                            sided
 Cerebellum:       Appears normal         Abdomen:          Appears normal
 Posterior Fossa:  Not well visualized    Abdominal Wall:   Not well visualized
 Nuchal Fold:      Not applicable (>20    Cord Vessels:     Appears normal (3
                   wks GA)                                  vessel cord)
 Face:             Not well visualized    Kidneys:          Appear normal
 Lips:             Appears normal         Bladder:          Appears normal
 Heart:            Appears normal         Spine:            Appears normal
                   (4CH, axis, and
                   situs)
 RVOT:             Appears normal         Lower             Visualized
                                          Extremities:
 LVOT:             Appears normal         Upper             Visualized
                                          Extremities:

 Other:  Fetus appears to be a female. Technically difficult due to advanced
         GA and fetal position.
Doppler - Fetal Vessels

 Umbilical Artery
 S/D:   3.75           93  %tile

Cervix Uterus Adnexa

 Cervix:       Not visualized (advanced GA >95wks)
 Uterus:       No abnormality visualized.

 Left Ovary:    Not visualized.
 Right Ovary:   Visualized with 1.1 x 1.2 x .9 cm echogenic area.
 Adnexa:     No abnormality visualized.
Impression

 Single IUP at 32w 3d
 The overall estimated fetal weight is at the 27th %tile.  The
 AC measures at the 7th %tile.
 BPP [DATE]
 UA Doppler studies - high normal S/D ratio (93rd %tile).  No
 AEDF or REDF noted.
 Normal amniotic fluid volume
Recommendations

 See separate consult note.
 Recommend 2x weekly NSTs with weekly AFIs and UA
 Doppler studies.
 Ultrasound for growth in 3 weeks

 If fetal growth restriction (EFW < 10th %tile) noted on follow
 up exam, would recommend delivery at 37 weeks.  If testing
 otherwiser remains reassuring, would recommend delivery at
 38-39 weeks in the absence of other complications.

                 Attending Physician, [email or web address]
